# Patient Record
Sex: Male | Born: 1950 | Race: White | Hispanic: No | Smoking: Never smoker
Health system: Southern US, Community
[De-identification: ages and names within clinical notes are randomized; demographics above are authoritative.]

## PROBLEM LIST (undated history)

## (undated) DIAGNOSIS — I10 Essential (primary) hypertension: Secondary | ICD-10-CM

## (undated) DIAGNOSIS — D66 Hereditary factor VIII deficiency: Secondary | ICD-10-CM

## (undated) HISTORY — PX: APPENDECTOMY: SHX54

---

## 2015-10-04 ENCOUNTER — Emergency Department
Admission: EM | Admit: 2015-10-04 | Discharge: 2015-10-04 | Disposition: A | Discharge status: Other Acute Inpatient Hospital | Payer: Self-pay | Attending: Emergency Medicine | Admitting: Emergency Medicine

## 2015-10-04 ENCOUNTER — Encounter: Payer: Self-pay | Admitting: Emergency Medicine

## 2015-10-04 ENCOUNTER — Emergency Department: Payer: Self-pay

## 2015-10-04 DIAGNOSIS — Y939 Activity, unspecified: Secondary | ICD-10-CM | POA: Insufficient documentation

## 2015-10-04 DIAGNOSIS — S065X0A Traumatic subdural hemorrhage without loss of consciousness, initial encounter: Secondary | ICD-10-CM | POA: Insufficient documentation

## 2015-10-04 DIAGNOSIS — I10 Essential (primary) hypertension: Secondary | ICD-10-CM | POA: Insufficient documentation

## 2015-10-04 DIAGNOSIS — S065XAA Traumatic subdural hemorrhage with loss of consciousness status unknown, initial encounter: Secondary | ICD-10-CM

## 2015-10-04 DIAGNOSIS — Y999 Unspecified external cause status: Secondary | ICD-10-CM | POA: Insufficient documentation

## 2015-10-04 DIAGNOSIS — W19XXXA Unspecified fall, initial encounter: Secondary | ICD-10-CM | POA: Insufficient documentation

## 2015-10-04 DIAGNOSIS — S065X9A Traumatic subdural hemorrhage with loss of consciousness of unspecified duration, initial encounter: Secondary | ICD-10-CM

## 2015-10-04 DIAGNOSIS — Y929 Unspecified place or not applicable: Secondary | ICD-10-CM | POA: Insufficient documentation

## 2015-10-04 HISTORY — DX: Hereditary factor VIII deficiency: D66

## 2015-10-04 HISTORY — DX: Essential (primary) hypertension: I10

## 2015-10-04 LAB — CBC
HCT: 46.5 % (ref 40.0–52.0)
HEMOGLOBIN: 16 g/dL (ref 13.0–18.0)
MCH: 32.2 pg (ref 26.0–34.0)
MCHC: 34.5 g/dL (ref 32.0–36.0)
MCV: 93.3 fL (ref 80.0–100.0)
PLATELETS: 252 10*3/uL (ref 150–440)
RBC: 4.98 MIL/uL (ref 4.40–5.90)
RDW: 13.3 % (ref 11.5–14.5)
WBC: 16.8 10*3/uL — AB (ref 3.8–10.6)

## 2015-10-04 LAB — APTT: APTT: 36 s (ref 24–36)

## 2015-10-04 LAB — COMPREHENSIVE METABOLIC PANEL
ALK PHOS: 55 U/L (ref 38–126)
ALT: 29 U/L (ref 17–63)
AST: 33 U/L (ref 15–41)
Albumin: 4.5 g/dL (ref 3.5–5.0)
Anion gap: 11 (ref 5–15)
BUN: 13 mg/dL (ref 6–20)
CALCIUM: 9.6 mg/dL (ref 8.9–10.3)
CHLORIDE: 93 mmol/L — AB (ref 101–111)
CO2: 26 mmol/L (ref 22–32)
CREATININE: 1.07 mg/dL (ref 0.61–1.24)
Glucose, Bld: 230 mg/dL — ABNORMAL HIGH (ref 65–99)
Potassium: 3.1 mmol/L — ABNORMAL LOW (ref 3.5–5.1)
SODIUM: 130 mmol/L — AB (ref 135–145)
Total Bilirubin: 0.9 mg/dL (ref 0.3–1.2)
Total Protein: 8.1 g/dL (ref 6.5–8.1)

## 2015-10-04 LAB — PROTIME-INR
INR: 0.88
Prothrombin Time: 12.2 seconds (ref 11.4–15.0)

## 2015-10-04 LAB — ETHANOL

## 2015-10-04 LAB — TROPONIN I: Troponin I: <0.03 ng/mL (ref <0.031)

## 2015-10-04 NOTE — ED Provider Notes (Signed)
[facility] Emergency Department Provider Note  ____________________________________________    I have reviewed the triage vital signs and the nursing notes.   HISTORY  Chief Complaint Fall    HPI Philip Huff is a 65 y.o. male who presents via EMS because of a fall last night. Patient does not remember when he fell. He does admit to drinking beers last night. Apparently he was found by family today, his father lives next to him. Patient complains of headache and "not feeling good". He denies neuro deficits. Denies blurry vision.He does admit to daily alcohol use. He denies extremity injury, no chest pain or shortness of breath no abdominal pain.     Past Medical History  Diagnosis Date  . Hypertension     There are no active problems to display for this patient.   History reviewed. No pertinent past surgical history.  No current outpatient prescriptions on file.  Allergies Review of patient's allergies indicates no known allergies.  History reviewed. No pertinent family history.  Social History Social History  Substance Use Topics  . Smoking status: Never Smoker   . Smokeless tobacco: None  . Alcohol Use: 33.6 oz/week    56 Cans of beer per week    Review of Systems  Constitutional: Positive for headache Eyes: Negative for change in vision ENT: Positive for neck pain Cardiovascular: Negative for chest pain Respiratory: Negative for shortness of breath. Gastrointestinal: Negative for abdominal pain  Musculoskeletal: Positive for neck pain Skin: "Goose egg" Neurological: Also for headache Psychiatric: no anxiety    ____________________________________________   PHYSICAL EXAM:  VITAL SIGNS: ED Triage Vitals  Enc Vitals Group     BP 10/04/15 1525 153/99 mmHg     Pulse Rate 10/04/15 1525 82     Resp 10/04/15 1525 18     Temp 10/04/15 1525 98.1 F (36.7 C)     Temp src --      SpO2 10/04/15 1520 95 %     Weight  10/04/15 1525 160 lb (72.576 kg)     Height 10/04/15 1525  (1.676 m)     Head Cir --      Peak Flow --      Pain Score 10/04/15 1528 7     Pain Loc --      Pain Edu? --      Excl. in GC? --     Constitutional: Large hematoma to the right forehead, alert and oriented Eyes: Conjunctivae are normal. No erythema or injection ENT   Head: Normocephalic. Large hematoma to the right forehead.   Mouth/Throat: Mucous membranes are moist. Cardiovascular: Normal rate, regular rhythm. Normal and symmetric distal pulses are present in the upper extremities.  Respiratory: Normal respiratory effort without tachypnea nor retractions. Breath sounds are clear and equal bilaterally.  Gastrointestinal: Soft and non-tender in all quadrants. No distention. There is no CVA tenderness. Genitourinary: deferred Musculoskeletal: Nontender with normal range of motion in all extremities. No lower extremity tenderness nor edema. Neurologic:  Normal speech and language. No gross focal neurologic deficits are appreciated. PERRLA, EOMI Skin:  Skin is warm, dry. Psychiatric: Mood and affect are normal. Patient exhibits appropriate insight and judgment.  ____________________________________________    LABS (pertinent positives/negatives)  Labs Reviewed  CBC - Abnormal; Notable for the following:    WBC 16.8 (*)    All other components within normal limits  COMPREHENSIVE METABOLIC PANEL - Abnormal; Notable for the following:    Sodium 130 (*)  Potassium 3.1 (*)    Chloride 93 (*)    Glucose, Bld 230 (*)    All other components within normal limits  TROPONIN I  ETHANOL  URINE DRUG SCREEN, QUALITATIVE ([facility] ONLY)    ____________________________________________   EKG  ED ECG REPORT I, Jene EveryKINNER, Elmer Boutelle, the attending physician, personally viewed and interpreted this ECG.  Date: 10/04/2015 EKG Time: 3:38 PM Rate: 76 Rhythm: normal sinus rhythm QRS Axis: normal Intervals: normal ST/T Wave  abnormalities: normal Conduction Disturbances: none Narrative Interpretation: unremarkable   ____________________________________________    RADIOLOGY  CT head shows large subdural hematoma  and cervical spine   ____________________________________________   PROCEDURES  Procedure(s) performed: none  Critical Care performed: yes  CRITICAL CARE Performed by: Jene EveryKINNER, Asta Corbridge   Total critical care time: 35 minutes  Critical care time was exclusive of separately billable procedures and treating other patients.  Critical care was necessary to treat or prevent imminent or life-threatening deterioration.  Critical care was time spent personally by me on the following activities: development of treatment plan with patient and/or surrogate as well as nursing, discussions with consultants, evaluation of patient's response to treatment, examination of patient, obtaining history from patient or surrogate, ordering and performing treatments and interventions, ordering and review of laboratory studies, ordering and review of radiographic studies, pulse oximetry and re-evaluation of patient's condition.   ____________________________________________   INITIAL IMPRESSION / ASSESSMENT AND PLAN / ED COURSE  Pertinent labs & imaging results that were available during my care of the patient were reviewed by me and considered in my medical decision making (see chart for details).  Patient presents after a fall with unknown down time. He has hematoma to his right forehead. The details are scant.  ----------------------------------------- 4:11 PM on 10/04/2015 -----------------------------------------  CT head shows subdural hematoma, acute. Discussed with the patient and his father, we will contact [facility] for transfer.  ----------------------------------------- 5:01 PM on 10/04/2015 -----------------------------------------  Called by Dr. Chestine Sporelark, confirming subdural. C-spine negative.  [facility] Family made aware  ____________________________________________   FINAL CLINICAL IMPRESSION(S) / ED DIAGNOSES  Final diagnoses:  Subdural hematoma (HCC)        Jene Everyobert Tanazia Achee, MD 10/04/15 1702

## 2015-10-04 NOTE — ED Notes (Signed)
Pt. Here via EMS for fall last night.  Pt. Does not remember when he fell.  Pt. Found by family.  Pt. States  Drinking alcohol last night.  Pt. States HA and back pain.

## 2015-10-04 NOTE — ED Notes (Signed)
Pt. Family states recent hx of falls.  Pt. Is alert and oriented at this time.  Pt. States having a couple of beers last night.  Pt. Does not remember today's fall.  Pt. Has bruising to frontal head.  Pt. Also states some back discomfort.  Family at bedside.

## 2015-10-04 NOTE — ED Notes (Signed)
Patient transported to CT 

## 2020-03-26 ENCOUNTER — Emergency Department: Payer: Medicare HMO

## 2020-03-26 ENCOUNTER — Inpatient Hospital Stay
Admission: EM | Admit: 2020-03-26 | Discharge: 2020-03-30 | DRG: 391 | Disposition: A | Discharge status: Home / Self Care | Payer: Medicare HMO | Attending: Internal Medicine | Admitting: Internal Medicine

## 2020-03-26 ENCOUNTER — Other Ambulatory Visit: Payer: Self-pay

## 2020-03-26 DIAGNOSIS — D67 Hereditary factor IX deficiency: Secondary | ICD-10-CM | POA: Diagnosis present

## 2020-03-26 DIAGNOSIS — Z87828 Personal history of other (healed) physical injury and trauma: Secondary | ICD-10-CM | POA: Diagnosis not present

## 2020-03-26 DIAGNOSIS — E876 Hypokalemia: Secondary | ICD-10-CM | POA: Diagnosis not present

## 2020-03-26 DIAGNOSIS — Z79899 Other long term (current) drug therapy: Secondary | ICD-10-CM

## 2020-03-26 DIAGNOSIS — K5720 Diverticulitis of large intestine with perforation and abscess without bleeding: Principal | ICD-10-CM | POA: Diagnosis present

## 2020-03-26 DIAGNOSIS — D72829 Elevated white blood cell count, unspecified: Secondary | ICD-10-CM | POA: Diagnosis present

## 2020-03-26 DIAGNOSIS — K5721 Diverticulitis of large intestine with perforation and abscess with bleeding: Secondary | ICD-10-CM | POA: Diagnosis not present

## 2020-03-26 DIAGNOSIS — Z9181 History of falling: Secondary | ICD-10-CM

## 2020-03-26 DIAGNOSIS — K611 Rectal abscess: Secondary | ICD-10-CM | POA: Diagnosis present

## 2020-03-26 DIAGNOSIS — Z20822 Contact with and (suspected) exposure to covid-19: Secondary | ICD-10-CM | POA: Diagnosis present

## 2020-03-26 DIAGNOSIS — K5780 Diverticulitis of intestine, part unspecified, with perforation and abscess without bleeding: Secondary | ICD-10-CM | POA: Diagnosis present

## 2020-03-26 DIAGNOSIS — K5781 Diverticulitis of intestine, part unspecified, with perforation and abscess with bleeding: Secondary | ICD-10-CM | POA: Diagnosis present

## 2020-03-26 DIAGNOSIS — I10 Essential (primary) hypertension: Secondary | ICD-10-CM | POA: Diagnosis present

## 2020-03-26 DIAGNOSIS — L0291 Cutaneous abscess, unspecified: Secondary | ICD-10-CM

## 2020-03-26 DIAGNOSIS — E875 Hyperkalemia: Secondary | ICD-10-CM | POA: Diagnosis present

## 2020-03-26 LAB — LACTIC ACID, PLASMA
Lactic Acid, Venous: 1.5 mmol/L (ref 0.5–1.9)
Lactic Acid, Venous: 1.4 mmol/L (ref 0.5–1.9)

## 2020-03-26 LAB — COMPREHENSIVE METABOLIC PANEL
ALT: 21 U/L (ref 0–44)
AST: 23 U/L (ref 15–41)
Albumin: 4.1 g/dL (ref 3.5–5.0)
Alkaline Phosphatase: 68 U/L (ref 38–126)
Anion gap: 14 (ref 5–15)
BUN: 19 mg/dL (ref 8–23)
CO2: 26 mmol/L (ref 22–32)
Calcium: 9.5 mg/dL (ref 8.9–10.3)
Chloride: 94 mmol/L — ABNORMAL LOW (ref 98–111)
Creatinine, Ser: 1.07 mg/dL (ref 0.61–1.24)
GFR, Estimated: >60 mL/min (ref >60)
Glucose, Bld: 143 mg/dL — ABNORMAL HIGH (ref 70–99)
Potassium: 3.4 mmol/L — ABNORMAL LOW (ref 3.5–5.1)
Sodium: 134 mmol/L — ABNORMAL LOW (ref 135–145)
Total Bilirubin: 1.1 mg/dL (ref 0.3–1.2)
Total Protein: 8.5 g/dL — ABNORMAL HIGH (ref 6.5–8.1)

## 2020-03-26 LAB — CBC
HCT: 45.9 % (ref 39.0–52.0)
Hemoglobin: 16.4 g/dL (ref 13.0–17.0)
MCH: 33 pg (ref 26.0–34.0)
MCHC: 35.7 g/dL (ref 30.0–36.0)
MCV: 92.4 fL (ref 80.0–100.0)
Platelets: 345 10*3/uL (ref 150–400)
RBC: 4.97 MIL/uL (ref 4.22–5.81)
RDW: 11.9 % (ref 11.5–15.5)
WBC: 15.4 10*3/uL — ABNORMAL HIGH (ref 4.0–10.5)
nRBC: 0 % (ref 0.0–0.2)

## 2020-03-26 LAB — RESPIRATORY PANEL BY RT PCR (FLU A&B, COVID)
Influenza A by PCR: NEGATIVE
Influenza B by PCR: NEGATIVE
SARS Coronavirus 2 by RT PCR: NEGATIVE

## 2020-03-26 LAB — URINALYSIS, COMPLETE (UACMP) WITH MICROSCOPIC
Bacteria, UA: NONE SEEN
Bilirubin Urine: NEGATIVE
Glucose, UA: NEGATIVE mg/dL
Hgb urine dipstick: NEGATIVE
Ketones, ur: NEGATIVE mg/dL
Leukocytes,Ua: NEGATIVE
Nitrite: NEGATIVE
Protein, ur: 30 mg/dL — AB
Specific Gravity, Urine: 1.018 (ref 1.005–1.030)
pH: 6 (ref 5.0–8.0)

## 2020-03-26 LAB — APTT: aPTT: 42 seconds — ABNORMAL HIGH (ref 24–36)

## 2020-03-26 LAB — LIPASE, BLOOD: Lipase: 32 U/L (ref 11–51)

## 2020-03-26 MED ORDER — IOHEXOL 350 MG/ML SOLN
75.0000 mL | Freq: Once | INTRAVENOUS | Status: AC | PRN
  Administered 2020-03-26: 75 mL via INTRAVENOUS

## 2020-03-26 MED ORDER — HYDROMORPHONE HCL 1 MG/ML IJ SOLN
0.5000 mg | Freq: Once | INTRAMUSCULAR | Status: AC
  Administered 2020-03-26: 0.5 mg via INTRAVENOUS
  Filled 2020-03-26: qty 1

## 2020-03-26 MED ORDER — METRONIDAZOLE IN NACL 5-0.79 MG/ML-% IV SOLN
500.0000 mg | Freq: Once | INTRAVENOUS | Status: AC
Start: 2020-03-26 — End: 2020-03-26
  Administered 2020-03-26: 500 mg via INTRAVENOUS
  Filled 2020-03-26: qty 100

## 2020-03-26 MED ORDER — POTASSIUM CHLORIDE CRYS ER 20 MEQ PO TBCR
40.0000 meq | EXTENDED_RELEASE_TABLET | Freq: Once | ORAL | Status: AC
  Administered 2020-03-26: 40 meq via ORAL
  Filled 2020-03-26: qty 2

## 2020-03-26 MED ORDER — METRONIDAZOLE IN NACL 5-0.79 MG/ML-% IV SOLN
500.0000 mg | Freq: Three times a day (TID) | INTRAVENOUS | Status: DC
  Administered 2020-03-27 – 2020-03-30 (×10): 500 mg via INTRAVENOUS
  Filled 2020-03-26 (×12): qty 100

## 2020-03-26 MED ORDER — HYDROMORPHONE HCL 1 MG/ML IJ SOLN
0.5000 mg | INTRAMUSCULAR | Status: DC | PRN
  Administered 2020-03-26 – 2020-03-28 (×6): 0.5 mg via INTRAVENOUS
  Filled 2020-03-26 (×6): qty 0.5

## 2020-03-26 MED ORDER — ONDANSETRON HCL 4 MG/2ML IJ SOLN
4.0000 mg | Freq: Once | INTRAMUSCULAR | Status: AC
  Administered 2020-03-26: 4 mg via INTRAVENOUS
  Filled 2020-03-26: qty 2

## 2020-03-26 MED ORDER — SODIUM CHLORIDE 0.9 % IV SOLN
1.0000 g | Freq: Once | INTRAVENOUS | Status: AC
  Administered 2020-03-26: 1 g via INTRAVENOUS
  Filled 2020-03-26: qty 10

## 2020-03-26 MED ORDER — LACTATED RINGERS IV BOLUS
1000.0000 mL | Freq: Once | INTRAVENOUS | Status: AC
  Administered 2020-03-26: 1000 mL via INTRAVENOUS

## 2020-03-26 MED ORDER — LACTATED RINGERS IV SOLN: INTRAVENOUS | Status: AC

## 2020-03-26 MED ORDER — SODIUM CHLORIDE 0.9 % IV SOLN
2.0000 g | INTRAVENOUS | Status: DC
  Administered 2020-03-27 – 2020-03-29 (×3): 2 g via INTRAVENOUS
  Filled 2020-03-26: qty 20
  Filled 2020-03-26 (×3): qty 2

## 2020-03-26 MED ORDER — ONDANSETRON HCL 4 MG PO TABS: 4.0000 mg | ORAL_TABLET | Freq: Three times a day (TID) | ORAL | Status: AC | PRN

## 2020-03-26 NOTE — ED Notes (Signed)
Unable to draw labs off of IV, called lab to ask them to straight stick patient for remaining lab work.

## 2020-03-26 NOTE — ED Triage Notes (Signed)
Pt comes pov with lower pelvic/abdominal pain for about 4 days. Pain with urination and trouble having a BM.

## 2020-03-26 NOTE — Progress Notes (Signed)
Briefly, this is a 69 y/o M presenting to the ED with roughly a week of lower abdominal/suprapubic discomfort, associated dysuria. No nausea or vomiting. No fevers or chills.  He also has a history of some kind of hemophilia (no further details available) and a subdural hematoma (per the EMR).  ED evaluation revealed leukocytosis with normal lactate, mild electrolyte abnormalities, and mild elevation of the aPTT.  CT scan was consistent with diverticulitis without evidence of perforation, but with an abscess in the pre-rectal space. He is hemodynamically stable and not in need of emergency surgical intervention at this time. Recommend admission to medicine, NPO, IVF, IV antibiotics, consultation to IR for percutaneous drain placement.  May need to involve hematology due to reported history of hemophilia. Full General Surgery consult note to follow in the AM.

## 2020-03-26 NOTE — ED Provider Notes (Signed)
[facility] Emergency Department Provider Note  ____________________________________________   First MD Initiated Contact with Patient 03/26/20 1815     (approximate)  I have reviewed the triage vital signs and the nursing notes.   HISTORY  Chief Complaint Abdominal Pain   HPI Philip Huff is a 69 y.o. male with a past medical history of HTN and unspecified hemophilia who presents for assessment of between 5 and 6 days of suprapubic abdominal discomfort associated with some burning with urination and constipation.  No prior similar episodes.  No clear alleviating aggravating or precipitating events.  Patient states he has been passing gas and has only been able to feel very small hard balls.  He denies any headache, earache, sore throat, fevers, chills, chest pain, cough, shortness of breath, back pain, rash, extremity pain, penile lesions or abnormal discharge, blood in stool, blood in his urine, or acute complaints.  Denies EtOH use illicit drug use, tobacco use.         Past Medical History:  Diagnosis Date  . Hemophilia (HCC)    "semi hemophilia"  . Hypertension     There are no problems to display for this patient.   History reviewed. No pertinent surgical history.  Prior to Admission medications   Not on File    Allergies Patient has no known allergies.  History reviewed. No pertinent family history.  Social History Social History   Tobacco Use  . Smoking status: Never Smoker  Substance Use Topics  . Alcohol use: Yes    Alcohol/week: 56.0 standard drinks    Types: 56 Cans of beer per week  . Drug use: No    Review of Systems  Review of Systems  Constitutional: Negative for chills and fever.  HENT: Negative for sore throat.   Eyes: Negative for pain.  Respiratory: Negative for cough and stridor.   Cardiovascular: Negative for chest pain.  Gastrointestinal: Positive for abdominal pain, constipation and nausea. Negative  for vomiting.  Genitourinary: Positive for dysuria.  Skin: Negative for rash.  Neurological: Negative for seizures, loss of consciousness and headaches.  Psychiatric/Behavioral: Negative for suicidal ideas.  All other systems reviewed and are negative.     ____________________________________________   PHYSICAL EXAM:  VITAL SIGNS: ED Triage Vitals  Enc Vitals Group     BP 03/26/20 1556 133/85     Pulse Rate 03/26/20 1556 95     Resp 03/26/20 1556 18     Temp 03/26/20 1556 97.7 F (36.5 C)     Temp Source 03/26/20 1556 Oral     SpO2 03/26/20 1556 99 %     Weight 03/26/20 1554 160 lb (72.6 kg)     Height 03/26/20 1554 5\' 6"  (1.676 m)     Head Circumference --      Peak Flow --      Pain Score 03/26/20 1554 9     Pain Loc --      Pain Edu? --      Excl. in GC? --    Vitals:   03/26/20 1556  BP: 133/85  Pulse: 95  Resp: 18  Temp: 97.7 F (36.5 C)  SpO2: 99%   Physical Exam Vitals and nursing note reviewed.  Constitutional:      Appearance: He is well-developed.  HENT:     Head: Normocephalic and atraumatic.     Right Ear: External ear normal.     Left Ear: External ear normal.     Nose: Nose normal.  Eyes:     Conjunctiva/sclera: Conjunctivae normal.  Cardiovascular:     Rate and Rhythm: Normal rate and regular rhythm.     Heart sounds: No murmur heard.   Pulmonary:     Effort: Pulmonary effort is normal. No respiratory distress.     Breath sounds: Normal breath sounds.  Abdominal:     General: There is distension.     Palpations: Abdomen is soft.     Tenderness: There is abdominal tenderness in the suprapubic area. There is no right CVA tenderness or left CVA tenderness.  Musculoskeletal:     Cervical back: Neck supple.     Right lower leg: No edema.     Left lower leg: No edema.  Skin:    General: Skin is warm and dry.     Capillary Refill: Capillary refill takes 2 to 3 seconds.  Neurological:     Mental Status: He is alert and oriented to person,  place, and time.  Psychiatric:        Mood and Affect: Mood normal.      ____________________________________________   LABS (all labs ordered are listed, but only abnormal results are displayed)  Labs Reviewed  COMPREHENSIVE METABOLIC PANEL - Abnormal; Notable for the following components:      Result Value   Sodium 134 (*)    Potassium 3.4 (*)    Chloride 94 (*)    Glucose, Bld 143 (*)    Total Protein 8.5 (*)    All other components within normal limits  CBC - Abnormal; Notable for the following components:   WBC 15.4 (*)    All other components within normal limits  URINALYSIS, COMPLETE (UACMP) WITH MICROSCOPIC - Abnormal; Notable for the following components:   Color, Urine AMBER (*)    APPearance HAZY (*)    Protein, ur 30 (*)    All other components within normal limits  APTT - Abnormal; Notable for the following components:   aPTT 42 (*)    All other components within normal limits  CULTURE, BLOOD (ROUTINE X 2)  CULTURE, BLOOD (ROUTINE X 2)  RESPIRATORY PANEL BY RT PCR (FLU A&B, COVID)  LIPASE, BLOOD  LACTIC ACID, PLASMA  LACTIC ACID, PLASMA   ____________________________________________   ____________________________________________  RADIOLOGY   Official radiology report(s): CT ABDOMEN PELVIS W CONTRAST  Result Date: 03/26/2020 CLINICAL DATA:  Abdominal pain for several days EXAM: CT ABDOMEN AND PELVIS WITH CONTRAST TECHNIQUE: Multidetector CT imaging of the abdomen and pelvis was performed using the standard protocol following bolus administration of intravenous contrast. CONTRAST:  44mL OMNIPAQUE IOHEXOL 350 MG/ML SOLN COMPARISON:  None. FINDINGS: Lower chest: No acute abnormality. Hepatobiliary: Fatty infiltration of the liver is noted. The gallbladder is within normal limits. Pancreas: Pancreas is unremarkable. Spleen: Normal in size without focal abnormality. Adrenals/Urinary Tract: Adrenal glands are within normal limits. Kidneys demonstrate a normal  enhancement pattern bilaterally. No renal calculi or urinary tract obstructive changes are seen. Delayed images demonstrate normal excretion of contrast. No obstructive changes are seen. The bladder is partially distended. Stomach/Bowel: Diverticular change of the colon is noted with evidence of diverticulitis in the distal sigmoid colon. There is a focal air-fluid collection (4.7 x 3.5 cm) identified just anterior to the rectum best seen on image number 68 of series 2 consistent with a diverticular abscess. This likely extends inferiorly from the more superior sigmoid diverticulitis. The more proximal colon is within normal limits. The appendix is not well visualized although no inflammatory changes to  suggest appendicitis are noted. Small bowel and stomach appear within normal limits. Vascular/Lymphatic: Aortic atherosclerosis. No enlarged abdominal or pelvic lymph nodes. Reproductive: Prostate is unremarkable. Other: No abdominal wall hernia or abnormality. No abdominopelvic ascites. Musculoskeletal: No acute or significant osseous findings. IMPRESSION: Diverticulitis with evidence of a pre rectal abscess as described above measuring 4.7 cm in greatest dimension. This likely a extends inferiorly from the sigmoid diverticulitis. No definitive fistulization to the bladder is seen. Fatty liver. Electronically Signed   By: Alcide Clever M.D.   On: 03/26/2020 19:53    ____________________________________________   PROCEDURES  Procedure(s) performed (including Critical Care):  .1-3 Lead EKG Interpretation Performed by: Gilles Chiquito, MD Authorized by: Gilles Chiquito, MD     Interpretation: normal     ECG rate assessment: normal     Rhythm: sinus rhythm     Ectopy: none     Conduction: normal     ____________________________________________   INITIAL IMPRESSION / ASSESSMENT AND PLAN / ED COURSE        Patient presents with [place] to history exam for assessment of several days of some lower  abdominal pain associated with constipation and burning with urination.  Patient is afebrile hemodynamically stable on arrival.  Differential includes but is not limited to diverticulitis, SBO, cystitis, pyelonephritis, kidney stone, pancreatitis.  Lipase is 32 not consistent with pancreatitis.  CMP remarkable for mild hyperkalemia with a K of 3.4 and otherwise no significant electrolyte or metabolic derangements.  There is no evidence of cholestasis.  CBC shows an elevated white blood cell count at 15.4 with normal hemoglobin and platelets.  UA has 30 protein but otherwise no evidence of infection or blood.  CT obtained shows no evidence of SBO pyelonephritis stone pancreatitis but does show evidence of diverticulitis with a 4.7 x 3.5 cm abscess.  No evidence of perforation or other acute intra-abdominal process.  Given abscess with elevated white blood cell count I did consult general surgery service.  Patient was also given below noted analgesia and antibiotics.  I did consult with Dr. Lady Gary of general surgery who stated no emergent surgery was indicated at this time and who agreed with antibiotics and medicine admission with plan to see the patient in morning.  I will plan to admit to medicine service.  ____________________________________________   FINAL CLINICAL IMPRESSION(S) / ED DIAGNOSES  Final diagnoses:  Diverticulitis of intestine with abscess without bleeding, unspecified part of intestinal tract  Hypokalemia    Medications  lactated ringers bolus 1,000 mL (has no administration in time range)  cefTRIAXone (ROCEPHIN) 1 g in sodium chloride 0.9 % 100 mL IVPB (1 g Intravenous New Bag/Given 03/26/20 2031)  metroNIDAZOLE (FLAGYL) IVPB 500 mg (has no administration in time range)  potassium chloride SA (KLOR-CON) CR tablet 40 mEq (40 mEq Oral Given 03/26/20 1936)  iohexol (OMNIPAQUE) 350 MG/ML injection 75 mL (75 mLs Intravenous Contrast Given 03/26/20 1941)  HYDROmorphone  (DILAUDID) injection 0.5 mg (0.5 mg Intravenous Given 03/26/20 2028)  ondansetron (ZOFRAN) injection 4 mg (4 mg Intravenous Given 03/26/20 2028)     ED Discharge Orders    None       Note:  This document was prepared using Dragon voice recognition software and may include unintentional dictation errors.   Gilles Chiquito, MD 03/26/20 2043

## 2020-03-26 NOTE — H&P (Addendum)
History and Physical   KOUROSH JABLONSKY GMW:[id number] DOB: 07/14/1950 DOA: 03/26/2020  PCP: Pcp, No  Patient coming from: home  I have personally briefly reviewed patient's old medical records in [facility] EMR.  Chief Concern: abdominal pain x 7 days  HPI: Philip Huff is a 69 y.o. male with medical history significant for hemophilia, h/o subdural hematoma status post fall in 2017, presents to ED for chief concern of abdominal dull pain that started Wednesday, 03/19/20.  He reports the pain at it's peak was a 9/10 and now he can't feel the pain at all.  He denies changes to diet and medications. Prior to this, he was spending a few days playing with his grandchildren. He denies F/C/N/V/Cough/chest pain/shortness of breath/dysuria/hematuria/fall.  He endorsed lost of appetite.  He states he has never experienced pain like this before.  ED Course: Discussed with ED provider.  ED provider has called general surgery and states neurosurgery agrees with antibiotics and admission.  Nothing to do tonight and will see the patient tomorrow morning.  Admit for diverticulitis.  CT abdomen pelvis with contrast in the emergency department showed diverticulitis with evidence of perirectal abscess as described measuring 4.7 cm in greatest dimension.  This likely extends inferiorly from the sigmoid diverticulitis.  No definitive fistulization to the bladder is seen.  In the ED he received IV metronidazole and IV ceftriaxone.  Potassium chloride 40 mill equivalent p.o., lactated Ringer's bolus 1 L.  Review of Systems: As per HPI otherwise 10 point review of systems negative.  Past Medical History:  Diagnosis Date  . Hemophilia (HCC)    "semi hemophilia"  . Hypertension    History reviewed. No pertinent surgical history.  Social History:  reports that he has never smoked. He does not have any smokeless tobacco history on file. He reports current alcohol use of about 56.0 standard drinks of  alcohol per week. He reports that he does not use drugs.  No Known Allergies  History reviewed. No pertinent family history.   Family history: Family history reviewed and not pertinent  Physical Exam: Vitals:   03/26/20 1554 03/26/20 1556 03/26/20 2050  BP:  133/85 140/87  Pulse:  95 79  Resp:  18 19  Temp:  97.7 F (36.5 C) 98 F (36.7 C)  TempSrc:  Oral Oral  SpO2:  99% 99%  Weight: 72.6 kg    Height: 5\' 6"  (1.676 m)     Constitutional: NAD, calm, comfortable Eyes: PERRL, lids and conjunctivae normal ENMT: Mucous membranes are moist. Posterior pharynx clear of any exudate or lesions. Normal dentition.  Neck: normal, supple, no masses, no thyromegaly Respiratory: clear to auscultation bilaterally, no wheezing, no crackles. Normal respiratory effort. No accessory muscle use.  Cardiovascular: Regular rate and rhythm, no murmurs / rubs / gallops. No extremity edema. 2+ pedal pulses. No carotid bruits.  Abdomen: Obese abdomen, bilateral lower abdomen tenderness, no masses palpated. No hepatosplenomegaly. Bowel sounds positive.  Musculoskeletal: no clubbing / cyanosis. No joint deformity upper and lower extremities. Good ROM, no contractures. Normal muscle tone.  Skin: no rashes, lesions, ulcers. No induration Neurologic: CN 2-12 grossly intact. Sensation intact. Strength 5/5 in all 4.  Psychiatric: Normal judgment and insight. Alert and oriented x 4. Normal mood.   Labs on Admission: I have personally reviewed following labs and imaging studies  CBC: Recent Labs  Lab 03/26/20 1556  WBC 15.4*  HGB 16.4  HCT 45.9  MCV 92.4  PLT 345   Basic Metabolic  Panel: Recent Labs  Lab 03/26/20 1556  NA 134*  K 3.4*  CL 94*  CO2 26  GLUCOSE 143*  BUN 19  CREATININE 1.07  CALCIUM 9.5   GFR: Estimated Creatinine Clearance: 59.6 mL/min (by C-G formula based on SCr of 1.07 mg/dL).  Liver Function Tests: Recent Labs  Lab 03/26/20 1556  AST 23  ALT 21  ALKPHOS 68  BILITOT  1.1  PROT 8.5*  ALBUMIN 4.1   Recent Labs  Lab 03/26/20 1556  LIPASE 32   Urine analysis:    Component Value Date/Time   COLORURINE AMBER (A) 03/26/2020 1556   APPEARANCEUR HAZY (A) 03/26/2020 1556   LABSPEC 1.018 03/26/2020 1556   PHURINE 6.0 03/26/2020 1556   GLUCOSEU NEGATIVE 03/26/2020 1556   HGBUR NEGATIVE 03/26/2020 1556   BILIRUBINUR NEGATIVE 03/26/2020 1556   KETONESUR NEGATIVE 03/26/2020 1556   PROTEINUR 30 (A) 03/26/2020 1556   NITRITE NEGATIVE 03/26/2020 1556   LEUKOCYTESUR NEGATIVE 03/26/2020 1556   Radiological Exams on Admission: Personally reviewed and I agree with radiologist reading as below.  CT ABDOMEN PELVIS W CONTRAST  Result Date: 03/26/2020 CLINICAL DATA:  Abdominal pain for several days EXAM: CT ABDOMEN AND PELVIS WITH CONTRAST TECHNIQUE: Multidetector CT imaging of the abdomen and pelvis was performed using the standard protocol following bolus administration of intravenous contrast. CONTRAST:  27mL OMNIPAQUE IOHEXOL 350 MG/ML SOLN COMPARISON:  None. FINDINGS: Lower chest: No acute abnormality. Hepatobiliary: Fatty infiltration of the liver is noted. The gallbladder is within normal limits. Pancreas: Pancreas is unremarkable. Spleen: Normal in size without focal abnormality. Adrenals/Urinary Tract: Adrenal glands are within normal limits. Kidneys demonstrate a normal enhancement pattern bilaterally. No renal calculi or urinary tract obstructive changes are seen. Delayed images demonstrate normal excretion of contrast. No obstructive changes are seen. The bladder is partially distended. Stomach/Bowel: Diverticular change of the colon is noted with evidence of diverticulitis in the distal sigmoid colon. There is a focal air-fluid collection (4.7 x 3.5 cm) identified just anterior to the rectum best seen on image number 68 of series 2 consistent with a diverticular abscess. This likely extends inferiorly from the more superior sigmoid diverticulitis. The more  proximal colon is within normal limits. The appendix is not well visualized although no inflammatory changes to suggest appendicitis are noted. Small bowel and stomach appear within normal limits. Vascular/Lymphatic: Aortic atherosclerosis. No enlarged abdominal or pelvic lymph nodes. Reproductive: Prostate is unremarkable. Other: No abdominal wall hernia or abnormality. No abdominopelvic ascites. Musculoskeletal: No acute or significant osseous findings. IMPRESSION: Diverticulitis with evidence of a pre rectal abscess as described above measuring 4.7 cm in greatest dimension. This likely a extends inferiorly from the sigmoid diverticulitis. No definitive fistulization to the bladder is seen. Fatty liver. Electronically Signed   By: Alcide Clever M.D.   On: 03/26/2020 19:53   EKG: Independently reviewed, showing sinus rhythm with a rate of 80 no ST elevation  Assessment/Plan  Active Problems:   Diverticulitis of intestine with abscess and bleeding   Leukocytosis   Diverticulitis with evidence pre-rectal abscess Leukocytosis -Abscess measuring 4.7 cm in greatest dimension, likely extends inferiorly from the sigmoid diverticulitis, no definitive fistulization into the bladder seen -Consultation order for IR for percutaneous drain placement has been placed -General surgery has been consulted by ED provider -Status post metronidazole and ceftriaxone IV in the ED, we will continue these -Status post 1 L lactated Ringer's in the ED -Started lactated Ringer's at 125 cc/h IVF -N.p.o. -IV Zofran 4 mg every  8 hours as needed -CBC in the a.m.  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 team to obtain medical records if needed -No  acute changes in mentation at this time   DVT prophylaxis: Holding pending PT and INR Code Status: Full code Diet: N.p.o. Family Communication: None at this time Disposition Plan: Pending clinical course Consults called: General surgery Admission status: Observation  Val Schiavo N Honi Name D.O. [facility]  If 7AM-7PM, please contact day-coverage provider [email or web address]  03/26/2020, 10:39 PM

## 2020-03-27 ENCOUNTER — Inpatient Hospital Stay: Payer: Medicare HMO

## 2020-03-27 ENCOUNTER — Encounter: Payer: Self-pay | Admitting: Internal Medicine

## 2020-03-27 DIAGNOSIS — D67 Hereditary factor IX deficiency: Secondary | ICD-10-CM | POA: Diagnosis present

## 2020-03-27 DIAGNOSIS — I10 Essential (primary) hypertension: Secondary | ICD-10-CM | POA: Diagnosis present

## 2020-03-27 DIAGNOSIS — K5780 Diverticulitis of intestine, part unspecified, with perforation and abscess without bleeding: Secondary | ICD-10-CM | POA: Diagnosis not present

## 2020-03-27 DIAGNOSIS — E875 Hyperkalemia: Secondary | ICD-10-CM | POA: Diagnosis present

## 2020-03-27 DIAGNOSIS — Z79899 Other long term (current) drug therapy: Secondary | ICD-10-CM | POA: Diagnosis not present

## 2020-03-27 DIAGNOSIS — E876 Hypokalemia: Secondary | ICD-10-CM | POA: Diagnosis present

## 2020-03-27 DIAGNOSIS — Z20822 Contact with and (suspected) exposure to covid-19: Secondary | ICD-10-CM | POA: Diagnosis present

## 2020-03-27 DIAGNOSIS — K5720 Diverticulitis of large intestine with perforation and abscess without bleeding: Principal | ICD-10-CM

## 2020-03-27 DIAGNOSIS — Z9181 History of falling: Secondary | ICD-10-CM | POA: Diagnosis not present

## 2020-03-27 DIAGNOSIS — K611 Rectal abscess: Secondary | ICD-10-CM | POA: Diagnosis present

## 2020-03-27 LAB — CBC
HCT: 39.9 % (ref 39.0–52.0)
Hemoglobin: 14.5 g/dL (ref 13.0–17.0)
MCH: 33.6 pg (ref 26.0–34.0)
MCHC: 36.3 g/dL — ABNORMAL HIGH (ref 30.0–36.0)
MCV: 92.6 fL (ref 80.0–100.0)
Platelets: 315 10*3/uL (ref 150–400)
RBC: 4.31 MIL/uL (ref 4.22–5.81)
RDW: 11.9 % (ref 11.5–15.5)
WBC: 11.2 10*3/uL — ABNORMAL HIGH (ref 4.0–10.5)
nRBC: 0 % (ref 0.0–0.2)

## 2020-03-27 LAB — PROTIME-INR
INR: 1 (ref 0.8–1.2)
Prothrombin Time: 13.1 seconds (ref 11.4–15.2)

## 2020-03-27 LAB — APTT: aPTT: 45 seconds — ABNORMAL HIGH (ref 24–36)

## 2020-03-27 LAB — COMPREHENSIVE METABOLIC PANEL
ALT: 17 U/L (ref 0–44)
AST: 19 U/L (ref 15–41)
Albumin: 3.2 g/dL — ABNORMAL LOW (ref 3.5–5.0)
Alkaline Phosphatase: 50 U/L (ref 38–126)
Anion gap: 10 (ref 5–15)
BUN: 20 mg/dL (ref 8–23)
CO2: 26 mmol/L (ref 22–32)
Calcium: 8.7 mg/dL — ABNORMAL LOW (ref 8.9–10.3)
Chloride: 99 mmol/L (ref 98–111)
Creatinine, Ser: 0.88 mg/dL (ref 0.61–1.24)
GFR, Estimated: >60 mL/min (ref >60)
Glucose, Bld: 109 mg/dL — ABNORMAL HIGH (ref 70–99)
Potassium: 3.8 mmol/L (ref 3.5–5.1)
Sodium: 135 mmol/L (ref 135–145)
Total Bilirubin: 0.9 mg/dL (ref 0.3–1.2)
Total Protein: 6.5 g/dL (ref 6.5–8.1)

## 2020-03-27 LAB — HIV ANTIBODY (ROUTINE TESTING W REFLEX): HIV Screen 4th Generation wRfx: NONREACTIVE

## 2020-03-27 MED ORDER — ADULT MULTIVITAMIN W/MINERALS CH
1.0000 | ORAL_TABLET | Freq: Every day | ORAL | Status: DC
  Administered 2020-03-28 – 2020-03-30 (×3): 1 via ORAL
  Filled 2020-03-27 (×3): qty 1

## 2020-03-27 MED ORDER — EZETIMIBE 10 MG PO TABS
10.0000 mg | ORAL_TABLET | Freq: Every day | ORAL | Status: DC
  Administered 2020-03-27 – 2020-03-30 (×4): 10 mg via ORAL
  Filled 2020-03-27 (×4): qty 1

## 2020-03-27 MED ORDER — MIDAZOLAM HCL 2 MG/2ML IJ SOLN
INTRAMUSCULAR | Status: AC | PRN
  Administered 2020-03-27 (×2): 1 mg via INTRAVENOUS

## 2020-03-27 MED ORDER — FENTANYL CITRATE (PF) 100 MCG/2ML IJ SOLN
INTRAMUSCULAR | Status: AC
  Filled 2020-03-27: qty 2

## 2020-03-27 MED ORDER — MIDAZOLAM HCL 5 MG/5ML IJ SOLN
INTRAMUSCULAR | Status: AC
  Filled 2020-03-27: qty 5

## 2020-03-27 MED ORDER — FENTANYL CITRATE (PF) 100 MCG/2ML IJ SOLN
INTRAMUSCULAR | Status: AC | PRN
Start: 2020-03-27 — End: 2020-03-27
  Administered 2020-03-27 (×2): 50 ug via INTRAVENOUS

## 2020-03-27 MED ORDER — SODIUM CHLORIDE 0.9% FLUSH
5.0000 mL | Freq: Three times a day (TID) | INTRAVENOUS | Status: DC
  Administered 2020-03-27 – 2020-03-30 (×9): 5 mL

## 2020-03-27 MED ORDER — METOPROLOL SUCCINATE ER 50 MG PO TB24
50.0000 mg | ORAL_TABLET | Freq: Every day | ORAL | Status: DC
  Administered 2020-03-27 – 2020-03-30 (×4): 50 mg via ORAL
  Filled 2020-03-27 (×4): qty 1

## 2020-03-27 MED ORDER — HYDROCHLOROTHIAZIDE 25 MG PO TABS: 25.0000 mg | ORAL_TABLET | Freq: Every day | ORAL | Status: DC

## 2020-03-27 NOTE — Consult Note (Addendum)
[facility] SURGICAL CONSULTATION NOTE (initial) - cpt: 32919   HISTORY OF PRESENT ILLNESS (HPI):  69 y.o. male presented to [facility] ED yesterday for evaluation of abdominal pain. Patient reports around 6 days ago he noticed the acute onset of vague lower abdominal pain. This was a crampy and aching pain which progressively worsened and remained constant. He denied any history of similar pain in the past. No associated fever, chills, nausea, emesis, SP, SOB, urinary changes, constipation, or bloody stools. He has a history of ruptured appendicitis in the past which required open appendectomy due to bleeding complications requiring exploratory laparotomy in the same setting. He does have a history of "bleeding disorder" and hemophilia is listed in the chart but he is unable to specify. He is colonoscopy naive but states he has been doing Cologuard. Work up in the ED was concerning for leukocytosis to 15.4K (now 11.2k this morning), renal function has remained normal, mild hypokalemia on admission to 3.4 but this now resolved, normal lactic acid levels x2, and BCX without growth x2. He did have CT Abdomen/Pelvis which was concerning for sigmoid diverticulitis with fluid collection near rectum concerning for abscess. He was admitted to the medicine service with IV Abx (Rocephin + Flagyl) and IR has been consulted for consideration of percutaneous drain.   Surgery is consulted by emergency medicine physician Dr. Antoine Primas, MD in this context for evaluation and management of diverticulitis.  PAST MEDICAL HISTORY (PMH):  Past Medical History:  Diagnosis Date   Hemophilia (HCC)    "semi hemophilia"   Hypertension      PAST SURGICAL HISTORY (PSH):  History reviewed. No pertinent surgical history.   MEDICATIONS:  Prior to Admission medications   Medication Sig Start Date End Date Taking? Authorizing Provider  hydrochlorothiazide (HYDRODIURIL) 25 MG tablet Take 25 mg by mouth daily.  02/12/20  Yes [provider]  losartan (COZAAR) 50 MG tablet Take 50 mg by mouth daily. 02/12/20  Yes [provider]  metoprolol succinate (TOPROL-XL) 50 MG 24 hr tablet Take 50 mg by mouth daily. 01/16/20  Yes [provider]     ALLERGIES:  No Known Allergies   SOCIAL HISTORY:  Social History   Socioeconomic History   Marital status: Divorced    Spouse name: Not on file   Number of children: Not on file   Years of education: Not on file   Highest education level: Not on file  Occupational History   Not on file  Tobacco Use   Smoking status: Never Smoker   Smokeless tobacco: Never Used  Substance and Sexual Activity   Alcohol use: Yes    Alcohol/week: 56.0 standard drinks    Types: 56 Cans of beer per week   Drug use: No   Sexual activity: Not on file  Other Topics Concern   Not on file  Social History Narrative   Not on file   Social Determinants of Health   Financial Resource Strain:    Difficulty of Paying Living Expenses: Not on file  Food Insecurity:    Worried About Running Out of Food in the Last Year: Not on file   Ran Out of Food in the Last Year: Not on file  Transportation Needs:    Lack of Transportation (Medical): Not on file   Lack of Transportation (Non-Medical): Not on file  Physical Activity:    Days of Exercise per Week: Not on file   Minutes of Exercise per Session: Not on file  Stress:    Feeling of Stress : Not on file  Social Connections:    Frequency of Communication with Friends and Family: Not on file   Frequency of Social Gatherings with Friends and Family: Not on file   Attends Religious Services: Not on file   Active Member of Clubs or Organizations: Not on file   Attends Banker Meetings: Not on file   Marital Status: Not on file  Intimate Partner Violence:    Fear of Current or Ex-Partner: Not on file   Emotionally Abused: Not on file   Physically Abused: Not on file   Sexually Abused: Not  on file     FAMILY HISTORY:  History reviewed. No pertinent family history.    REVIEW OF SYSTEMS:  Review of Systems  Constitutional: Negative for chills and fever.  HENT: Negative for congestion and sore throat.   Respiratory: Negative for cough and shortness of breath.   Cardiovascular: Negative for chest pain and palpitations.  Gastrointestinal: Positive for abdominal pain. Negative for blood in stool, constipation, diarrhea, nausea and vomiting.  Genitourinary: Negative for dysuria and urgency.  All other systems reviewed and are negative.   VITAL SIGNS:  Temp:  [97.7 F (36.5 C)-98.5 F (36.9 C)] 97.7 F (36.5 C) (11/04 0349) Pulse Rate:  [74-95] 74 (11/04 0349) Resp:  [16-19] 16 (11/04 0349) BP: (119-140)/(85-96) 130/87 (11/04 0349) SpO2:  [97 %-99 %] 97 % (11/04 0349) Weight:  [72.6 kg] 72.6 kg (11/03 1554)     Height: 5\' 6"  (167.6 cm) Weight: 72.6 kg BMI (Calculated): 25.84   INTAKE/OUTPUT:  11/03 0701 - 11/04 0700 In: -  Out: 175 [Urine:175]  PHYSICAL EXAM:  Physical Exam Vitals and nursing note reviewed. Exam conducted with a chaperone present.  Constitutional:      General: He is not in acute distress.    Appearance: He is well-developed. He is obese. He is not ill-appearing.  HENT:     Head: Normocephalic and atraumatic.  Eyes:     General: No scleral icterus.    Extraocular Movements: Extraocular movements intact.  Cardiovascular:     Rate and Rhythm: Normal rate and regular rhythm.     Heart sounds: Normal heart sounds. No murmur heard.   Pulmonary:     Effort: Pulmonary effort is normal. No respiratory distress.     Breath sounds: Normal breath sounds.  Abdominal:     General: Abdomen is protuberant. A surgical scar is present. There is no distension.     Palpations: Abdomen is soft.     Tenderness: There is abdominal tenderness in the suprapubic area and left lower quadrant. There is no guarding or rebound.  Genitourinary:    Comments:  Deferred Skin:    General: Skin is warm and dry.     Coloration: Skin is not jaundiced or pale.  Neurological:     General: No focal deficit present.     Mental Status: He is alert and oriented to person, place, and time.  Psychiatric:        Mood and Affect: Mood normal.        Behavior: Behavior normal.      Labs:  CBC Latest Ref Rng & Units 03/27/2020 03/26/2020 10/04/2015  WBC 4.0 - 10.5 K/uL 11.2(H) 15.4(H) 16.8(H)  Hemoglobin 13.0 - 17.0 g/dL 10/06/2015 96.2 22.9  Hematocrit 39 - 52 % 39.9 45.9 46.5  Platelets 150 - 400 K/uL 315 345 252   CMP Latest Ref Rng & Units 03/27/2020  03/26/2020 10/04/2015  Glucose 70 - 99 mg/dL 761(Y) 073(X) 106(Y)  BUN 8 - 23 mg/dL 20 19 13   Creatinine 0.61 - 1.24 mg/dL 6.94 8.54  Sodium 135 - 145 mmol/L 135 134(L) 130(L)  Potassium 3.5 - 5.1 mmol/L 3.8 3.4(L) 3.1(L)  Chloride 98 - 111 mmol/L 99 94(L) 93(L)  CO2 22 - 32 mmol/L 26 26 26   Calcium 8.9 - 10.3 mg/dL 6.27) 9.5 9.6  Total Protein 6.5 - 8.1 g/dL 6.5 ) 8.1  Total Bilirubin 0.3 - 1.2 mg/dL 0.9 1.1 0.9  Alkaline Phos 38 - 126 U/L 50 68 55  AST 15 - 41 U/L 19 23 33  ALT 0 - 44 U/L 17 21 29      Imaging studies:   CT Abdomen/Pelvis (03/26/2020) personally reviewed showing inflammatory changes surrounding sigmoid colon concerning for diverticulitis with fluid collection anterior to the rectum concerning for abscess, and radiologist report reviewed:  IMPRESSION: Diverticulitis with evidence of a pre rectal abscess as described above measuring 4.7 cm in greatest dimension. This likely a extends inferiorly from the sigmoid diverticulitis. No definitive fistulization to the bladder is seen.   Assessment/Plan: (ICD-10's: K9.80) 69 y.o. male with leukocytosis and abdominal pain found to have complicated diverticulitis and abscess without pneumoperitoneum or peritonitis.   - Appreciate medicine admission  - No emergent surgical intervention; He understands that if he fails conservative  measures or clinically deteriorates, then he will require more urgent intervention and likely colostomy  - Agree with IR consultation; suspect this abscess may be in a challenging location to drain  - For now, continue NPO + IVF Resuscitation  - Continue IV Abx (Rocephin + Flagyl)   - Monitor abdominal examination  - Pain control prn; antiemetics prn  - Monitor leukocytosis; improving   - Mobilization as tolerates  - May need to ask hematology to evaluate given his unspecified "bleeding disorder," in the event that surgery becomes necessary.  - Further management per primary service; we will follow    All of the above findings and recommendations were discussed with the patient, and all of patient's questions were answered to his expressed satisfaction.  Thank you for the opportunity to participate in this patient's care.   -- 13/07/2019, PA-C [facility] 03/27/2020, 7:29 AM [phone] M-F: 7am - 4pm  I saw and evaluated the patient.  I agree with the above documentation, exam, and plan, which I have edited where appropriate. Lynden Oxford  9:37 AM

## 2020-03-27 NOTE — Plan of Care (Signed)
Nutrition Education Note  Pt with new diverticulitis diagnosis   RD provided "Nutrition and Diverticulitis" handout with supporting information. Reviewed patient's dietary recall. Provided examples of low and high fiber foods. Discouraged intake of high fiber foods, high fat foods, spicy foods, processed foods, caffeine and red meats when having a flare. Encouraged pt to cook foods until they are soft and chew foods well to help aid in digestion. Also recommend frequent small meals. Encouraged use of a multi-vitamin and protein supplements while having a flare.    RD encourage intake of high fiber foods when not having a flare.   Expect fair compliance.  Body mass index is 25.82 kg/m. Pt meets criteria for overweight based on current BMI.  No further nutrition interventions warranted at this time. RD contact information provided. If additional nutrition issues arise, please re-consult RD.  Betsey Holiday MS, RD, LDN Please refer to [facility] for RD and/or RD on-call/weekend/after hours pager

## 2020-03-27 NOTE — Procedures (Signed)
Pre procedural Dx: Pelvic abscess Post procedural Dx: Same  Technically successful CT guided placed of a 10 Fr drainage catheter placement into the pelvic abscess via left transgluteal approach yielding 30 cc of purulent, slightly bloody fluid .    A representative aspirated sample was capped and sent to the laboratory for analysis.    EBL: Trace Complications: None immediate  Katherina Right, MD Pager #: [phone]

## 2020-03-27 NOTE — Progress Notes (Signed)
Patient clinically stable post Abscess drain placement per Dr Grace Isaac, tolerated well with 10Fr drain size. Awake/alert and oriented post procedure. Vitals stable pre and post procedure.report given to TIA RN on 2C with questions answered. Received Versed 2 mg along with Fentanyl 100 mcg IV for procedure.

## 2020-03-27 NOTE — Progress Notes (Signed)
Nutrition Brief Note  Patient identified on the Malnutrition Screening Tool (MST) Report  69 year old male with history significant for hemophilia of unknown type, history of subdural hematoma status post mechanical fall in 2017 who presents with diverticulitis.   Met with pt in room today. Pt reports good appetite and oral intake at baseline but reports that he has not eaten anything for the past 4 days r/t abdominal pain. Pt NPO today for IR drain placement today. Pt reports that he is starving today and wants to eat. Pt is upset that he will have to start on a liquid diet. RD discussed with pt the importance of adequate nutrition needed to preserve lean muscle. Pt declines all nutritional interventions today. Pt reports that he is unable to drink any type of nutrition supplement as he reports they are nasty. RD recommended that pt try and choose a good protein source with all of his meals once his diet is advanced. RD will add daily MVI.   Wt Readings from Last 15 Encounters:  03/26/20 72.6 kg  10/04/15 72.6 kg    Body mass index is 25.82 kg/m. Patient meets criteria for overweight based on current BMI.   Nutrition-Focused physical exam completed. Findings are mild fat depletions in arms, mild muscle depletions in calves and patellar regions and no edema.   Pt declines all nutritional interventions at this time   Koleen Distance MS, RD, LDN Please refer to [facility] for RD and/or RD on-call/weekend/after hours pager

## 2020-03-27 NOTE — Consult Note (Signed)
Chief Complaint: Pelvic abscess   Referring Physician(s): Cannon (Surgery)  Patient Status: [facility] - In-pt  History of Present Illness: Philip Huff is a 69 y.o. male with past medical history significant for hemophilia who was found to have a diverticular abscess located within the central aspect of the pelvis.  As such, patient now presents for attempted CT-guided aspiration and drainage catheter placement.  He continues to complain of abdominal/pelvic pain.  He is otherwise without complaint.  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 No pertinent surgical history.  Allergies: Patient has no known allergies.  Medications: Prior to Admission medications   Medication Sig Start Date End Date Taking? Authorizing Provider  ezetimibe (ZETIA) 10 MG tablet Take 10 mg by mouth daily.   Yes [provider]  hydrochlorothiazide (HYDRODIURIL) 25 MG tablet Take 25 mg by mouth daily. 02/12/20  Yes [provider]  losartan (COZAAR) 50 MG tablet Take 50 mg by mouth daily. 02/12/20  Yes [provider]  metoprolol succinate (TOPROL-XL) 50 MG 24 hr tablet Take 50 mg by mouth daily. 01/16/20  Yes [provider]     History reviewed. No pertinent family history.  Social History   Socioeconomic History   Marital status: Divorced    Spouse name: Not on file   Number of children: Not on file   Years of education: Not on file   Highest education level: Not on file  Occupational History   Not on file  Tobacco Use   Smoking status: Never Smoker   Smokeless tobacco: Never Used  Substance and Sexual Activity   Alcohol use: Yes    Alcohol/week: 56.0 standard drinks    Types: 56 Cans of beer per week   Drug use: No   Sexual activity: Not on file  Other Topics Concern   Not on file  Social History Narrative   Not on file   Social Determinants of Health    Financial Resource Strain:    Difficulty of Paying Living Expenses: Not on file  Food Insecurity:    Worried About Running Out of Food in the Last Year: Not on file   Ran Out of Food in the Last Year: Not on file  Transportation Needs:    Lack of Transportation (Medical): Not on file   Lack of Transportation (Non-Medical): Not on file  Physical Activity:    Days of Exercise per Week: Not on file   Minutes of Exercise per Session: Not on file  Stress:    Feeling of Stress : Not on file  Social Connections:    Frequency of Communication with Friends and Family: Not on file   Frequency of Social Gatherings with Friends and Family: Not on file   Attends Religious Services: Not on file   Active Member of Clubs or Organizations: Not on file   Attends Banker Meetings: Not on file   Marital Status: Not on file    ECOG Status: 1 - Symptomatic but completely ambulatory  Review of Systems: A 12 point ROS discussed and pertinent positives are indicated in the HPI above.  All other systems are negative.  Review of Systems  Vital Signs: BP 124/78    Pulse 76    Temp (!) 97.5 F (36.4 C)    Resp 20    Ht 5\' 6"  (1.676 m)    Wt 72.6 kg  SpO2 100%    BMI 25.82 kg/m   Physical Exam  Imaging: CT ABDOMEN PELVIS W CONTRAST  Result Date: 03/26/2020 CLINICAL DATA:  Abdominal pain for several days EXAM: CT ABDOMEN AND PELVIS WITH CONTRAST TECHNIQUE: Multidetector CT imaging of the abdomen and pelvis was performed using the standard protocol following bolus administration of intravenous contrast. CONTRAST:  37mL OMNIPAQUE IOHEXOL 350 MG/ML SOLN COMPARISON:  None. FINDINGS: Lower chest: No acute abnormality. Hepatobiliary: Fatty infiltration of the liver is noted. The gallbladder is within normal limits. Pancreas: Pancreas is unremarkable. Spleen: Normal in size without focal abnormality. Adrenals/Urinary Tract: Adrenal glands are within normal limits. Kidneys  demonstrate a normal enhancement pattern bilaterally. No renal calculi or urinary tract obstructive changes are seen. Delayed images demonstrate normal excretion of contrast. No obstructive changes are seen. The bladder is partially distended. Stomach/Bowel: Diverticular change of the colon is noted with evidence of diverticulitis in the distal sigmoid colon. There is a focal air-fluid collection (4.7 x 3.5 cm) identified just anterior to the rectum best seen on image number 68 of series 2 consistent with a diverticular abscess. This likely extends inferiorly from the more superior sigmoid diverticulitis. The more proximal colon is within normal limits. The appendix is not well visualized although no inflammatory changes to suggest appendicitis are noted. Small bowel and stomach appear within normal limits. Vascular/Lymphatic: Aortic atherosclerosis. No enlarged abdominal or pelvic lymph nodes. Reproductive: Prostate is unremarkable. Other: No abdominal wall hernia or abnormality. No abdominopelvic ascites. Musculoskeletal: No acute or significant osseous findings. IMPRESSION: Diverticulitis with evidence of a pre rectal abscess as described above measuring 4.7 cm in greatest dimension. This likely a extends inferiorly from the sigmoid diverticulitis. No definitive fistulization to the bladder is seen. Fatty liver. Electronically Signed   By: Alcide Clever M.D.   On: 03/26/2020 19:53    Labs:  CBC: Recent Labs    03/26/20 1556 03/27/20 0524  WBC 15.4* 11.2*  HGB 16.4 14.5  HCT 45.9 39.9  PLT 345 315    COAGS: Recent Labs    03/26/20 2010 03/27/20 0524  INR  --  1.0  APTT 42* 45*    BMP: Recent Labs    03/26/20 1556 03/27/20 0524  NA 134* 135  K 3.4* 3.8  CL 94* 99  CO2 26 26  GLUCOSE 143* 109*  BUN 19 20  CALCIUM 9.5 8.7*  CREATININE 1.07 0.88  GFRNONAA >60 >60    LIVER FUNCTION TESTS: Recent Labs    03/26/20 1556 03/27/20 0524  BILITOT 1.1 0.9  AST 23 19  ALT 21 17   ALKPHOS 68 50  PROT 8.5* 6.5  ALBUMIN 4.1 3.2*    TUMOR MARKERS: No results for input(s): AFPTM, CEA, CA199, CHROMGRNA in the last 8760 hours.  Assessment and Plan:  Philip Huff is a 69 y.o. male with past medical history significant for hemophilia who was found to have a diverticular abscess located within the central aspect of the pelvis.  As such, patient now presents for attempted CT-guided aspiration and drainage catheter placement.  He continues to complain of abdominal/pelvic pain.  He is otherwise without complaint.  Risks and benefits CT-guided transgluteal aspiration intervention catheter placement was discussed with the patient including bleeding, infection, damage to adjacent structures, bowel perforation/fistula connection, and sepsis.  All of the patient's questions were answered, patient is agreeable to proceed. Consent signed and in chart.    Thank you for this interesting consult.  I greatly enjoyed meeting Philip Confer  Huff and look forward to participating in their care.  A copy of this report was sent to the requesting provider on this date.  Electronically Signed: Simonne Come, MD 03/27/2020, 3:26 PM   I spent a total of 20 Minutes in face to face in clinical consultation, greater than 50% of which was counseling/coordinating care for CT-guided percutaneous drainage catheter placement.

## 2020-03-27 NOTE — Progress Notes (Signed)
PROGRESS NOTE    Philip MemosMichael L Huff  ZOX:[id number] DOB: [birth year] DOA: 03/26/2020 PCP: Pcp, No    Brief Narrative:  69 year old male with history significant for hemophilia of unknown type, history of subdural hematoma status post mechanical fall in 2017 who presents to the ED with abdominal pain that started 6 days prior to presentation.  Found on imaging in the emergency department to have diverticulitis with 4.7 cm prerectal abscess.  General surgery consulted from emergency department.  No surgical intervention warranted at this time.  Recommend IR consultation for image guided drainage.  Patient states the pain is improved after initiation of intravenous fluids and antibiotics.  He is scheduled for IR guided drainage of prerectal abscess today.   Assessment & Plan:   Active Problems:   Diverticulitis of intestine with abscess and bleeding   Leukocytosis   Diverticulitis of intestine with abscess  Diverticulitis with rectal abscess Abscess measuring 4.7 cm in greatest dimension IR consult for drainage placed Drainage may be difficult due to location Plan: Continue maintenance fluids N.p.o. 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 Patient was instructed to follow-up few years ago but never did Coagulation studies reassuring at this time Plan: Monitor for any bleeding Or involved hematology should surgical intervention be warranted  History of subdural hematoma In 2017, secondary to mechanical fall No acute issues     DVT prophylaxis: SCD Code Status: Full Family Communication: None today Disposition Plan: Status is: Inpatient  Remains inpatient appropriate because:Inpatient level of care appropriate due to severity of illness   Dispo: The patient is from: Home               Anticipated d/c is to: Home              Anticipated d/c date is: 2 days              Patient currently is not medically stable to d/c.   N.p.o. for IR guided drainage attempt of prerectal abscess today.  Disposition plan following procedure attempt.      Consultants:   IR  General surgery  Procedures:   Image guided abscess drainage, 03/27/2020  Antimicrobials:   Ceftriaxone  Metronidazole   Subjective: Patient seen and examined.  Reports improvement in abdominal pain.  No other complaints  Objective: Vitals:   03/27/20 0100 03/27/20 0349 03/27/20 0800 03/27/20 1100  BP: (!) 119/96 130/87 138/89 134/86  Pulse: 85 74 77 77  Resp: 18 16 18 18   Temp: 98.5 F (36.9 C) 97.7 F (36.5 C) 97.8 F (36.6 C) (!) 97.5 F (36.4 C)  TempSrc: Oral Oral    SpO2: 99% 97% 99% 100%  Weight:      Height:        Intake/Output Summary (Last 24 hours) at 03/27/2020 1227 Last data filed at 03/27/2020 1100 Gross per 24 hour  Intake --  Output 525 ml  Net -525 ml   Filed Weights   03/26/20 1554  Weight: 72.6 kg    Examination:  General exam: Appears calm and comfortable  Respiratory system: Clear to auscultation. Respiratory effort normal. Cardiovascular system: S1 & S2 heard, RRR. No JVD, murmurs, rubs, gallops or clicks. No pedal edema. Gastrointestinal system: Nondistended, diffusely tender to palpation, normoactive bowel sounds Central nervous system: Alert and oriented. No focal  neurological deficits. Extremities: Symmetric 5 x 5 power. Skin: No rashes, lesions or ulcers Psychiatry: Judgement and insight appear normal. Mood & affect appropriate.     Data Reviewed: I have personally reviewed following labs and imaging studies  CBC: Recent Labs  Lab 03/26/20 1556 03/27/20 0524  WBC 15.4* 11.2*  HGB 16.4 14.5  HCT 45.9 39.9  MCV 92.4 92.6  PLT 345 315   Basic Metabolic Panel: Recent Labs  Lab 03/26/20 1556 03/27/20 0524  NA 134* 135  K 3.4* 3.8   CL 94* 99  CO2 26 26  GLUCOSE 143* 109*  BUN 19 20  CREATININE 1.07 0.88  CALCIUM 9.5 8.7*   GFR: Estimated Creatinine Clearance: 72.5 mL/min (by C-G formula based on SCr of 0.88 mg/dL). Liver Function Tests: Recent Labs  Lab 03/26/20 1556 03/27/20 0524  AST 23 19  ALT 21 17  ALKPHOS 68 50  BILITOT 1.1 0.9  PROT 8.5* 6.5  ALBUMIN 4.1 3.2*   Recent Labs  Lab 03/26/20 1556  LIPASE 32   No results for input(s): AMMONIA in the last 168 hours. Coagulation Profile: Recent Labs  Lab 03/27/20 0524  INR 1.0   Cardiac Enzymes: No results for input(s): CKTOTAL, CKMB, CKMBINDEX, TROPONINI in the last 168 hours. BNP (last 3 results) No results for input(s): PROBNP in the last 8760 hours. HbA1C: No results for input(s): HGBA1C in the last 72 hours. CBG: No results for input(s): GLUCAP in the last 168 hours. Lipid Profile: No results for input(s): CHOL, HDL, LDLCALC, TRIG, CHOLHDL, LDLDIRECT in the last 72 hours. Thyroid Function Tests: No results for input(s): TSH, T4TOTAL, FREET4, T3FREE, THYROIDAB in the last 72 hours. Anemia Panel: No results for input(s): VITAMINB12, FOLATE, FERRITIN, TIBC, IRON, RETICCTPCT in the last 72 hours. Sepsis Labs: Recent Labs  Lab 03/26/20 1937 03/26/20 2010  LATICACIDVEN 1.5 1.4    Recent Results (from the past 240 hour(s))  Blood culture (routine x 2)     Status: None (Preliminary result)   Collection Time: 03/26/20  8:38 PM   Specimen: BLOOD  Result Value Ref Range Status   Specimen Description BLOOD LEFT ANTECUBITAL  Final   Special Requests   Final    BOTTLES DRAWN AEROBIC AND ANAEROBIC Blood Culture results may not be optimal due to an inadequate volume of blood received in culture bottles   Culture   Final    NO GROWTH < 12 HOURS Performed at [facility], [address] [place], [place] [address]    Report Status PENDING  Incomplete  Blood culture (routine x 2)     Status: None (Preliminary result)    Collection Time: 03/26/20  8:38 PM   Specimen: BLOOD  Result Value Ref Range Status   Specimen Description BLOOD BLOOD RIGHT FOREARM  Final   Special Requests   Final    BOTTLES DRAWN AEROBIC AND ANAEROBIC Blood Culture results may not be optimal due to an inadequate volume of blood received in culture bottles   Culture   Final    NO GROWTH < 12 HOURS Performed at [facility], [address], [place], [place] [address]    Report Status PENDING  Incomplete  Respiratory Panel by RT PCR (Flu A&B, Covid) - Nasopharyngeal Swab     Status: None   Collection Time: 03/26/20  8:39 PM   Specimen: Nasopharyngeal Swab  Result Value Ref Range Status   SARS Coronavirus 2 by RT PCR NEGATIVE NEGATIVE Final    Comment: (NOTE)  SARS-CoV-2 target nucleic acids are NOT DETECTED.  The SARS-CoV-2 RNA is generally detectable in upper respiratoy specimens during the acute phase of infection. The lowest concentration of SARS-CoV-2 viral copies this assay can detect is 131 copies/mL. A negative result does not preclude SARS-Cov-2 infection and should not be used as the sole basis for treatment or other patient management decisions. A negative result may occur with  improper specimen collection/handling, submission of specimen other than nasopharyngeal swab, presence of viral mutation(s) within the areas targeted by this assay, and inadequate number of viral copies (<131 copies/mL). A negative result must be combined with clinical observations, patient history, and epidemiological information. The expected result is Negative.  Fact Sheet for Patients:  https://www.moore.com/  Fact Sheet for Healthcare Providers:  https://www.young.biz/  This test is no t yet approved or cleared by the [place] FDA and  has been authorized for detection and/or diagnosis of SARS-CoV-2 by FDA under an Emergency Use Authorization (EUA). This EUA will remain  in effect  (meaning this test can be used) for the duration of the COVID-19 declaration under Section 564(b)(1) of the Act, 21 U.S.C. section 360bbb-3(b)(1), unless the authorization is terminated or revoked sooner.     Influenza A by PCR NEGATIVE NEGATIVE Final   Influenza B by PCR NEGATIVE NEGATIVE Final    Comment: (NOTE) The Xpert Xpress SARS-CoV-2/FLU/RSV assay is intended as an aid in  the diagnosis of influenza from Nasopharyngeal swab specimens and  should not be used as a sole basis for treatment. Nasal washings and  aspirates are unacceptable for Xpert Xpress SARS-CoV-2/FLU/RSV  testing.  Fact Sheet for Patients: https://www.moore.com/  Fact Sheet for Healthcare Providers: https://www.young.biz/  This test is not yet approved or cleared by the [place] FDA and  has been authorized for detection and/or diagnosis of SARS-CoV-2 by  FDA under an Emergency Use Authorization (EUA). This EUA will remain  in effect (meaning this test can be used) for the duration of the  Covid-19 declaration under Section 564(b)(1) of the Act, 21  U.S.C. section 360bbb-3(b)(1), unless the authorization is  terminated or revoked. Performed at [facility], [address] [place], [place] [address]          Radiology Studies: CT ABDOMEN PELVIS W CONTRAST  Result Date: 03/26/2020 CLINICAL DATA:  Abdominal pain for several days EXAM: CT ABDOMEN AND PELVIS WITH CONTRAST TECHNIQUE: Multidetector CT imaging of the abdomen and pelvis was performed using the standard protocol following bolus administration of intravenous contrast. CONTRAST:  61mL OMNIPAQUE IOHEXOL 350 MG/ML SOLN COMPARISON:  None. FINDINGS: Lower chest: No acute abnormality. Hepatobiliary: Fatty infiltration of the liver is noted. The gallbladder is within normal limits. Pancreas: Pancreas is unremarkable. Spleen: Normal in size without focal abnormality. Adrenals/Urinary Tract: Adrenal  glands are within normal limits. Kidneys demonstrate a normal enhancement pattern bilaterally. No renal calculi or urinary tract obstructive changes are seen. Delayed images demonstrate normal excretion of contrast. No obstructive changes are seen. The bladder is partially distended. Stomach/Bowel: Diverticular change of the colon is noted with evidence of diverticulitis in the distal sigmoid colon. There is a focal air-fluid collection (4.7 x 3.5 cm) identified just anterior to the rectum best seen on image number 68 of series 2 consistent with a diverticular abscess. This likely extends inferiorly from the more superior sigmoid diverticulitis. The more proximal colon is within normal limits. The appendix is not well visualized although no inflammatory changes to suggest appendicitis are noted. Small bowel and stomach appear  within normal limits. Vascular/Lymphatic: Aortic atherosclerosis. No enlarged abdominal or pelvic lymph nodes. Reproductive: Prostate is unremarkable. Other: No abdominal wall hernia or abnormality. No abdominopelvic ascites. Musculoskeletal: No acute or significant osseous findings. IMPRESSION: Diverticulitis with evidence of a pre rectal abscess as described above measuring 4.7 cm in greatest dimension. This likely a extends inferiorly from the sigmoid diverticulitis. No definitive fistulization to the bladder is seen. Fatty liver. Electronically Signed   By: Alcide Clever M.D.   On: 03/26/2020 19:53        Scheduled Meds: Continuous Infusions: . cefTRIAXone (ROCEPHIN)  IV    . lactated ringers 125 mL/hr at 03/27/20 0840  . metronidazole 500 mg (03/27/20 0500)     LOS: 0 days    Time spent: 25 minutes    Tresa Moore, MD [facility] Pager [phone]  If 7PM-7AM, please contact night-coverage 03/27/2020, 12:27 PM

## 2020-03-28 DIAGNOSIS — K5720 Diverticulitis of large intestine with perforation and abscess without bleeding: Secondary | ICD-10-CM | POA: Diagnosis not present

## 2020-03-28 DIAGNOSIS — K5780 Diverticulitis of intestine, part unspecified, with perforation and abscess without bleeding: Secondary | ICD-10-CM | POA: Diagnosis not present

## 2020-03-28 LAB — BASIC METABOLIC PANEL
Anion gap: 11 (ref 5–15)
BUN: 14 mg/dL (ref 8–23)
CO2: 25 mmol/L (ref 22–32)
Calcium: 8.5 mg/dL — ABNORMAL LOW (ref 8.9–10.3)
Chloride: 94 mmol/L — ABNORMAL LOW (ref 98–111)
Creatinine, Ser: 0.88 mg/dL (ref 0.61–1.24)
GFR, Estimated: >60 mL/min (ref >60)
Glucose, Bld: 102 mg/dL — ABNORMAL HIGH (ref 70–99)
Potassium: 3.1 mmol/L — ABNORMAL LOW (ref 3.5–5.1)
Sodium: 130 mmol/L — ABNORMAL LOW (ref 135–145)

## 2020-03-28 LAB — CULTURE, BLOOD (ROUTINE X 2): Culture: NO GROWTH

## 2020-03-28 LAB — CBC
HCT: 37.8 % — ABNORMAL LOW (ref 39.0–52.0)
Hemoglobin: 13.2 g/dL (ref 13.0–17.0)
MCH: 32.6 pg (ref 26.0–34.0)
MCHC: 34.9 g/dL (ref 30.0–36.0)
MCV: 93.3 fL (ref 80.0–100.0)
Platelets: 271 10*3/uL (ref 150–400)
RBC: 4.05 MIL/uL — ABNORMAL LOW (ref 4.22–5.81)
RDW: 11.9 % (ref 11.5–15.5)
WBC: 13.8 10*3/uL — ABNORMAL HIGH (ref 4.0–10.5)
nRBC: 0 % (ref 0.0–0.2)

## 2020-03-28 MED ORDER — MORPHINE SULFATE (PF) 2 MG/ML IV SOLN
2.0000 mg | INTRAVENOUS | Status: DC | PRN
  Administered 2020-03-28 – 2020-03-29 (×3): 2 mg via INTRAVENOUS
  Filled 2020-03-28 (×3): qty 1

## 2020-03-28 MED ORDER — SODIUM CHLORIDE 0.9 % IV SOLN
INTRAVENOUS | Status: DC | PRN
  Administered 2020-03-28 – 2020-03-29 (×3): 250 mL via INTRAVENOUS

## 2020-03-28 MED ORDER — ACETAMINOPHEN 325 MG PO TABS: 650.0000 mg | ORAL_TABLET | Freq: Four times a day (QID) | ORAL | Status: DC | PRN

## 2020-03-28 MED ORDER — OXYCODONE HCL 5 MG PO TABS
5.0000 mg | ORAL_TABLET | ORAL | Status: DC | PRN
  Administered 2020-03-28 – 2020-03-30 (×7): 5 mg via ORAL
  Filled 2020-03-28 (×7): qty 1

## 2020-03-28 MED ORDER — POTASSIUM CHLORIDE CRYS ER 20 MEQ PO TBCR
20.0000 meq | EXTENDED_RELEASE_TABLET | Freq: Two times a day (BID) | ORAL | Status: DC
  Administered 2020-03-28 – 2020-03-30 (×5): 20 meq via ORAL
  Filled 2020-03-28 (×5): qty 1

## 2020-03-28 NOTE — Progress Notes (Addendum)
[facility] SURGICAL PROGRESS NOTE (cpt [phone])  Hospital Day(s): 1.   Interval History: Patient seen and examined, no acute events or new complaints overnight. Patient reports he overall feels better from an abdominal standpoint but has soreness in his butt from drain site. Pain medications are helping but he is trying to limit his use of these. He denies fever, chills, nausea, emesis. Slight bump in leukocytosis this morning to 13.8K which may be related to recent drain placement. Renal function normal with sCr - 0.88. He does have mild hypokalemia to 3.1. He did undergo percutaneous drainage of prerectal abscess with IR yesterday (11/04) and CX from this are pending. He remains on Rocephin + flagyl. His diet was advanced to full liquids and is tolerating this well.   Review of Systems:  Constitutional: denies fever, chills  HEENT: denies cough or congestion  Respiratory: denies any shortness of breath  Cardiovascular: denies chest pain or palpitations  Gastrointestinal: denies abdominal pain, N/V, or diarrhea/and bowel function as per interval history Genitourinary: denies burning with urination or urinary frequency  Vital signs in last 24 hours: [min-max] current  Temp:  [97.5 F (36.4 C)-98.7 F (37.1 C)] 98.7 F (37.1 C) (11/05 0637) Pulse Rate:  [75-89] 77 (11/05 0637) Resp:  [16-20] 18 (11/05 0637) BP: (110-140)/(73-99) 126/77 (11/05 0637) SpO2:  [91 %-100 %] 97 % (11/05 0637)     Height: 5\' 6"  (167.6 cm) Weight: 72.6 kg BMI (Calculated): 25.84   Intake/Output last 2 shifts:  11/04 0701 - 11/05 0700 In: 1000 [P.O.:600; IV Piggyback:400] Out: 1000 [Urine:1000]   Physical Exam:  Constitutional: alert, cooperative and no distress  HENT: normocephalic without obvious abnormality  Eyes: PERRL, EOM's grossly intact and symmetric  Respiratory: breathing non-labored at rest  Cardiovascular: regular rate and sinus rhythm  Gastrointestinal: Soft, non-tender,  non-distended, no rebound/guarding. Drain in left gluteus, output appears serosanguinous Musculoskeletal: No edema or wounds, motor and sensation grossly intact, NT    Labs:  CBC Latest Ref Rng & Units 03/27/2020 03/26/2020 10/04/2015  WBC 4.0 - 10.5 K/uL 11.2(H) 15.4(H) 16.8(H)  Hemoglobin 13.0 - 17.0 g/dL 10/06/2015 85.2 77.8  Hematocrit 39 - 52 % 39.9 45.9 46.5  Platelets 150 - 400 K/uL 315 345 252   CMP Latest Ref Rng & Units 03/27/2020 03/26/2020 10/04/2015  Glucose 70 - 99 mg/dL 10/06/2015) 353(I) 144(R)  BUN 8 - 23 mg/dL 20 19 13   Creatinine 0.61 - 1.24 mg/dL 154(M 0.86  Sodium 135 - 145 mmol/L 135 134(L) 130(L)  Potassium 3.5 - 5.1 mmol/L 3.8 3.4(L) 3.1(L)  Chloride 98 - 111 mmol/L 99 94(L) 93(L)  CO2 22 - 32 mmol/L 26 26 26   Calcium 8.9 - 10.3 mg/dL 7.61) 9.5 9.6  Total Protein 6.5 - 8.1 g/dL 6.5 9.50) 8.1  Total Bilirubin 0.3 - 1.2 mg/dL 0.9 1.1 0.9  Alkaline Phos 38 - 126 U/L 50 68 55  AST 15 - 41 U/L 19 23 33  ALT 0 - 44 U/L 17 21 29     Imaging studies: No new pertinent imaging studies   Assessment/Plan: (ICD-10's: K24.80) 69 y.o. male with slight bump in leukocytosis but overall marked clinical improvement complicated diverticulitis and abscess without pneumoperitoneum or peritonitis s/p IR placement of percutaneous drain on 11/04   - As long as he remains pain free, I think it would be reasonable to try soft diet later today  - Replete K+; monitor   - Continue IV Abx (Rocephin + Flagyl); follow up Cx from  IR drainage               - Monitor abdominal examination  - Continue percutaneous drain; monitor and record output             - Pain control prn; antiemetics prn             - Monitor leukocytosis; slightly elevated; trend             - Mobilization as tolerates             - May need to ask hematology to evaluate given his unspecified "bleeding disorder," in the event that surgery becomes necessary.             - Further management per primary service; we will follow     All of the above findings and recommendations were discussed with the patient, and the medical team, and all of patient's questions were answered to his expressed satisfaction.  -- Lynden Oxford, PA-C [facility] 03/28/2020, 7:13 AM [phone] M-F: 7am - 4pm  I saw and evaluated the patient.  I agree with the above documentation, exam, and plan, which I have edited where appropriate. Duanne Guess  11:28 AM

## 2020-03-28 NOTE — Progress Notes (Signed)
PROGRESS NOTE    Philip Huff  PFX:[id number] DOB: [birth year] DOA: 03/26/2020 PCP: Pcp, No    Brief Narrative:  69 year old male with history significant for hemophilia of unknown type, history of subdural hematoma status post mechanical fall in 2017 who presents to the ED with abdominal pain that started 6 days prior to presentation.  Found on imaging in the emergency department to have diverticulitis with 4.7 cm prerectal abscess.  General surgery consulted from emergency department.  No surgical intervention warranted at this time.  Recommend IR consultation for image guided drainage.  Patient is status post IR guided drainage of prerectal abscess and placement of indwelling drain.  Cultures are pending.  Remains on antibiotics.  Tolerated full liquids without issue.   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 Patient was instructed to follow-up few years ago but never did Coagulation studies reassuring at this time Plan: Monitor for any bleeding We will involve hematology should any surgical intervention be needed during this admission  History of subdural hematoma In 2017, secondary to mechanical fall No acute issues     DVT prophylaxis: SCD Code Status: Full Family Communication: None today Disposition Plan: Status is: Inpatient  Remains inpatient  appropriate because:Inpatient level of care appropriate due to severity of illness   Dispo: The patient is from: Home              Anticipated d/c is to: Home              Anticipated d/c date is: 2 days              Patient currently is not medically stable to d/c.   Continue monitoring post IR drain placement for prerectal abscess.  Remains on IV antibiotics.  Slowly advancing diet.  If no surgical intervention warranted anticipate discharge readiness within 48 hours.      Consultants:   IR  General surgery  Procedures:   Image guided abscess drainage, 03/27/2020  Antimicrobials:   Ceftriaxone  Metronidazole   Subjective: Patient seen and examined.  Improvement in abdominal pain.  Some soreness in the buttock near drain insertion site.  Tolerating full liquids  Objective: Vitals:   03/27/20 1545 03/28/20 0026 03/28/20 0359 03/28/20 0637  BP: 135/79 111/82 115/81 126/77  Pulse: 79 89 87 77  Resp: 16 18 18 18   Temp:  97.9 F (36.6 C) 98.6 F (37 C) 98.7 F (37.1 C)  TempSrc:  Oral Oral Oral  SpO2: 98% 98% 91% 97%  Weight:      Height:        Intake/Output Summary (Last 24 hours) at 03/28/2020 1138 Last data filed at 03/28/2020 1052 Gross per 24 hour  Intake 1240 ml  Output 650 ml  Net 590 ml   Filed Weights   03/26/20 1554  Weight: 72.6 kg    Examination:   General exam: Appears calm and comfortable  Respiratory system: Clear to auscultation. Respiratory effort normal. Cardiovascular system: S1 & S2 heard, RRR. No JVD, murmurs, rubs, gallops or clicks. No pedal edema. Gastrointestinal system: Nondistended, mild tender to palpation, normal bowel sounds, drainage catheter in place Central nervous system: Alert and oriented. No focal neurological deficits. Extremities: Symmetric 5 x 5 power. Skin: No rashes or lesions, Psychiatry: Judgement and insight appear normal. Mood & affect appropriate.    Data Reviewed: I have personally reviewed following  labs and imaging studies  CBC: Recent Labs  Lab 03/26/20 1556 03/27/20 0524 03/28/20 0759  WBC 15.4* 11.2* 13.8*  HGB 16.4 14.5 13.2  HCT 45.9 39.9 37.8*  MCV 92.4 92.6 93.3  PLT 345 315 271   Basic Metabolic Panel: Recent Labs  Lab 03/26/20 1556 03/27/20 0524 03/28/20 0759  NA 134* 135 130*  K 3.4* 3.8 3.1*  CL 94* 99 94*  CO2 26 26 25   GLUCOSE 143* 109* 102*  BUN 19 20 14   CREATININE 1.07 0.88 0.88  CALCIUM 9.5 8.7* 8.5*   GFR: Estimated Creatinine Clearance: 72.5 mL/min (by C-G formula based on SCr of 0.88 mg/dL). Liver Function Tests: Recent Labs  Lab 03/26/20 1556 03/27/20 0524  AST 23 19  ALT 21 17  ALKPHOS 68 50  BILITOT 1.1 0.9  PROT 8.5* 6.5  ALBUMIN 4.1 3.2*   Recent Labs  Lab 03/26/20 1556  LIPASE 32   No results for input(s): AMMONIA in the last 168 hours. Coagulation Profile: Recent Labs  Lab 03/27/20 0524  INR 1.0   Cardiac Enzymes: No results for input(s): CKTOTAL, CKMB, CKMBINDEX, TROPONINI in the last 168 hours. BNP (last 3 results) No results for input(s): PROBNP in the last 8760 hours. HbA1C: No results for input(s): HGBA1C in the last 72 hours. CBG: No results for input(s): GLUCAP in the last 168 hours. Lipid Profile: No results for input(s): CHOL, HDL, LDLCALC, TRIG, CHOLHDL, LDLDIRECT in the last 72 hours. Thyroid Function Tests: No results for input(s): TSH, T4TOTAL, FREET4, T3FREE, THYROIDAB in the last 72 hours. Anemia Panel: No results for input(s): VITAMINB12, FOLATE, FERRITIN, TIBC, IRON, RETICCTPCT in the last 72 hours. Sepsis Labs: Recent Labs  Lab 03/26/20 1937 03/26/20 2010  LATICACIDVEN 1.5 1.4    Recent Results (from the past 240 hour(s))  Blood culture (routine x 2)     Status: None (Preliminary result)   Collection Time: 03/26/20  8:38 PM   Specimen: BLOOD  Result Value Ref Range Status   Specimen Description BLOOD LEFT ANTECUBITAL  Final   Special Requests   Final    BOTTLES DRAWN AEROBIC AND  ANAEROBIC Blood Culture results may not be optimal due to an inadequate volume of blood received in culture bottles   Culture   Final    NO GROWTH 2 DAYS Performed at [facility], [address] [place], [place] [address]    Report Status PENDING  Incomplete  Blood culture (routine x 2)     Status: None (Preliminary result)   Collection Time: 03/26/20  8:38 PM   Specimen: BLOOD  Result Value Ref Range Status   Specimen Description BLOOD BLOOD RIGHT FOREARM  Final   Special Requests   Final    BOTTLES DRAWN AEROBIC AND ANAEROBIC Blood Culture results may not be optimal due to an inadequate volume of blood received in culture bottles   Culture   Final    NO GROWTH 2 DAYS Performed at [facility], [address] [place], [place] [address]  Report Status PENDING  Incomplete  Respiratory Panel by RT PCR (Flu A&B, Covid) - Nasopharyngeal Swab     Status: None   Collection Time: 03/26/20  8:39 PM   Specimen: Nasopharyngeal Swab  Result Value Ref Range Status   SARS Coronavirus 2 by RT PCR NEGATIVE NEGATIVE Final    Comment: (NOTE) SARS-CoV-2 target nucleic acids are NOT DETECTED.  The SARS-CoV-2 RNA is generally detectable in upper respiratoy specimens during the acute phase of infection. The lowest concentration of SARS-CoV-2 viral copies this assay can detect is 131 copies/mL. A negative result does not preclude SARS-Cov-2 infection and should not be used as the sole basis for treatment or other patient management decisions. A negative result may occur with  improper specimen collection/handling, submission of specimen other than nasopharyngeal swab, presence of viral mutation(s) within the areas targeted by this assay, and inadequate number of viral copies (<131 copies/mL). A negative result must be combined with clinical observations, patient history, and epidemiological information. The expected result is Negative.  Fact Sheet for Patients:    https://www.moore.com/  Fact Sheet for Healthcare Providers:  https://www.young.biz/  This test is no t yet approved or cleared by the [place] FDA and  has been authorized for detection and/or diagnosis of SARS-CoV-2 by FDA under an Emergency Use Authorization (EUA). This EUA will remain  in effect (meaning this test can be used) for the duration of the COVID-19 declaration under Section 564(b)(1) of the Act, 21 U.S.C. section 360bbb-3(b)(1), unless the authorization is terminated or revoked sooner.     Influenza A by PCR NEGATIVE NEGATIVE Final   Influenza B by PCR NEGATIVE NEGATIVE Final    Comment: (NOTE) The Xpert Xpress SARS-CoV-2/FLU/RSV assay is intended as an aid in  the diagnosis of influenza from Nasopharyngeal swab specimens and  should not be used as a sole basis for treatment. Nasal washings and  aspirates are unacceptable for Xpert Xpress SARS-CoV-2/FLU/RSV  testing.  Fact Sheet for Patients: https://www.moore.com/  Fact Sheet for Healthcare Providers: https://www.young.biz/  This test is not yet approved or cleared by the [place] FDA and  has been authorized for detection and/or diagnosis of SARS-CoV-2 by  FDA under an Emergency Use Authorization (EUA). This EUA will remain  in effect (meaning this test can be used) for the duration of the  Covid-19 declaration under Section 564(b)(1) of the Act, 21  U.S.C. section 360bbb-3(b)(1), unless the authorization is  terminated or revoked. Performed at [facility], [address] [place], [place] [address]   Aerobic/Anaerobic Culture (surgical/deep wound)     Status: None (Preliminary result)   Collection Time: 03/27/20  3:33 PM   Specimen: Abscess  Result Value Ref Range Status   Specimen Description   Final    ABSCESS Performed at [facility], [address], [place], [place] [address]    Special  Requests   Final    Normal Performed at [facility], [address] [place], [place] [address]    Gram Stain   Final    ABUNDANT WBC PRESENT, PREDOMINANTLY PMN ABUNDANT GRAM POSITIVE COCCI ABUNDANT GRAM POSITIVE RODS MODERATE FUNGAL ELEMENTS SEEN RARE GRAM NEGATIVE RODS    Culture   Final    TOO YOUNG TO READ Performed at [facility], [address], [place], [place] [address]    Report Status PENDING  Incomplete         Radiology Studies: CT ABDOMEN PELVIS W CONTRAST  Result Date: 03/26/2020 CLINICAL  DATA:  Abdominal pain for several days EXAM: CT ABDOMEN AND PELVIS WITH CONTRAST TECHNIQUE: Multidetector CT imaging of the abdomen and pelvis was performed using the standard protocol following bolus administration of intravenous contrast. CONTRAST:  75mL OMNIPAQUE IOHEXOL 350 MG/ML SOLN COMPARISON:  None. FINDINGS: Lower chest: No acute abnormality. Hepatobiliary: Fatty infiltration of the liver is noted. The gallbladder is within normal limits. Pancreas: Pancreas is unremarkable. Spleen: Normal in size without focal abnormality. Adrenals/Urinary Tract: Adrenal glands are within normal limits. Kidneys demonstrate a normal enhancement pattern bilaterally. No renal calculi or urinary tract obstructive changes are seen. Delayed images demonstrate normal excretion of contrast. No obstructive changes are seen. The bladder is partially distended. Stomach/Bowel: Diverticular change of the colon is noted with evidence of diverticulitis in the distal sigmoid colon. There is a focal air-fluid collection (4.7 x 3.5 cm) identified just anterior to the rectum best seen on image number 68 of series 2 consistent with a diverticular abscess. This likely extends inferiorly from the more superior sigmoid diverticulitis. The more proximal colon is within normal limits. The appendix is not well visualized although no inflammatory changes to suggest appendicitis are noted. Small bowel and  stomach appear within normal limits. Vascular/Lymphatic: Aortic atherosclerosis. No enlarged abdominal or pelvic lymph nodes. Reproductive: Prostate is unremarkable. Other: No abdominal wall hernia or abnormality. No abdominopelvic ascites. Musculoskeletal: No acute or significant osseous findings. IMPRESSION: Diverticulitis with evidence of a pre rectal abscess as described above measuring 4.7 cm in greatest dimension. This likely a extends inferiorly from the sigmoid diverticulitis. No definitive fistulization to the bladder is seen. Fatty liver. Electronically Signed   By: Alcide Clever M.D.   On: 03/26/2020 19:53   CT IMAGE GUIDED DRAINAGE BY PERCUTANEOUS CATHETER  Result Date: 03/27/2020 INDICATION: Diverticular abscess. Please perform CT-guided percutaneous aspiration and/or drainage catheter for infection source control purposes EXAM: CT-GUIDED LEFT TRANS GLUTEAL APPROACH PERCUTANEOUS DRAINAGE CATHETER PLACEMENT COMPARISON:  CT abdomen and pelvis-03/26/2020 MEDICATIONS: The patient is currently admitted to the hospital and receiving intravenous antibiotics. The antibiotics were administered within an appropriate time frame prior to the initiation of the procedure. ANESTHESIA/SEDATION: Moderate (conscious) sedation was employed during this procedure. A total of Versed 2 mg and Fentanyl 100 mcg was administered intravenously. Moderate Sedation Time: 10 minutes. The patient's level of consciousness and vital signs were monitored continuously by radiology nursing throughout the procedure under my direct supervision. CONTRAST:  None COMPLICATIONS: None immediate. PROCEDURE: Informed written consent was obtained from the patient after a discussion of the risks, benefits and alternatives to treatment. The patient was placed prone on the CT gantry and a pre procedural CT was performed re-demonstrating the known abscess/fluid collection within the midline of the lower pelvis with dominant air in fluid component  measuring approximately 4.2 x 3.7 cm (image 29, series 2). The procedure was planned. A timeout was performed prior to the initiation of the procedure. The skin overlying the left buttocks was prepped and draped in the usual sterile fashion. The overlying soft tissues were anesthetized with 1% lidocaine with epinephrine. Appropriate trajectory was planned with the use of a 22 gauge spinal needle. An 18 gauge trocar needle was advanced into the abscess/fluid collection and a short Amplatz super stiff wire was coiled within the collection. Appropriate positioning was confirmed with a limited CT scan. The tract was serially dilated allowing placement of a 10 [place] all-purpose drainage catheter. Appropriate positioning was confirmed with a limited postprocedural CT scan. Approximately 30 ml of purulent fluid was  aspirated. The tube was connected to a drainage bag and sutured in place. A dressing was placed. The patient tolerated the procedure well without immediate post procedural complication. IMPRESSION: Successful CT guided placement of a 10 [place] all purpose drain catheter into the pelvic abscess via left trans gluteal approach with aspiration of 30 mL of purulent fluid. Samples were sent to the laboratory as requested by the ordering clinical team. Electronically Signed   By: Simonne Come M.D.   On: 03/27/2020 16:19        Scheduled Meds: . ezetimibe  10 mg Oral Daily  . metoprolol succinate  50 mg Oral Daily  . multivitamin with minerals  1 tablet Oral Daily  . potassium chloride  20 mEq Oral BID  . sodium chloride flush  5 mL Intracatheter Q8H   Continuous Infusions: . cefTRIAXone (ROCEPHIN)  IV Stopped (03/27/20 1818)  . metronidazole 500 mg (03/28/20 0629)     LOS: 1 day    Time spent: 25 minutes    Tresa Moore, MD [facility] Pager [phone]  If 7PM-7AM, please contact night-coverage 03/28/2020, 11:38 AM

## 2020-03-28 NOTE — Progress Notes (Signed)
Please note the photo entered in the media section on 11/05 was entered in error.

## 2020-03-29 DIAGNOSIS — K5720 Diverticulitis of large intestine with perforation and abscess without bleeding: Secondary | ICD-10-CM | POA: Diagnosis not present

## 2020-03-29 LAB — BASIC METABOLIC PANEL
Anion gap: 10 (ref 5–15)
BUN: 12 mg/dL (ref 8–23)
CO2: 25 mmol/L (ref 22–32)
Calcium: 8.6 mg/dL — ABNORMAL LOW (ref 8.9–10.3)
Chloride: 96 mmol/L — ABNORMAL LOW (ref 98–111)
Creatinine, Ser: 0.86 mg/dL (ref 0.61–1.24)
GFR, Estimated: >60 mL/min (ref >60)
Glucose, Bld: 98 mg/dL (ref 70–99)
Potassium: 3.7 mmol/L (ref 3.5–5.1)
Sodium: 131 mmol/L — ABNORMAL LOW (ref 135–145)

## 2020-03-29 LAB — CBC
HCT: 39.2 % (ref 39.0–52.0)
Hemoglobin: 13.3 g/dL (ref 13.0–17.0)
MCH: 31.8 pg (ref 26.0–34.0)
MCHC: 33.9 g/dL (ref 30.0–36.0)
MCV: 93.8 fL (ref 80.0–100.0)
Platelets: 293 10*3/uL (ref 150–400)
RBC: 4.18 MIL/uL — ABNORMAL LOW (ref 4.22–5.81)
RDW: 11.9 % (ref 11.5–15.5)
WBC: 12.2 10*3/uL — ABNORMAL HIGH (ref 4.0–10.5)
nRBC: 0 % (ref 0.0–0.2)

## 2020-03-29 LAB — CULTURE, BLOOD (ROUTINE X 2): Culture: NO GROWTH

## 2020-03-29 MED ORDER — HYDROMORPHONE HCL 1 MG/ML IJ SOLN
0.5000 mg | INTRAMUSCULAR | Status: DC | PRN
  Administered 2020-03-29 – 2020-03-30 (×3): 0.5 mg via INTRAVENOUS
  Filled 2020-03-29 (×3): qty 0.5

## 2020-03-29 NOTE — Progress Notes (Signed)
PROGRESS NOTE    Philip Huff  TKZ:[id number] DOB: 07/29/1950 DOA: 03/26/2020 PCP: Pcp, No    Brief Narrative:  69 year old male with history significant for hemophilia of unknown type, history of subdural hematoma status post mechanical fall in 2017 who presents to the ED with abdominal pain that started 6 days prior to presentation.  Found on imaging in the emergency department to have diverticulitis with 4.7 cm prerectal abscess.  General surgery consulted from emergency department.  No surgical intervention warranted at this time.  Recommend IR consultation for image guided drainage.  Patient is status post IR guided drainage of prerectal abscess and placement of indwelling drain.  Cultures from abscess drainage are pending.  Remains on IV antibiotics.  Tolerated soft diet without issue.    Assessment & Plan:   Active Problems:   Diverticulitis of intestine with abscess and bleeding   Leukocytosis   Diverticulitis of intestine with abscess  Diverticulitis with rectal abscess Abscess measuring 4.7 cm in greatest dimension Status post evacuation of abscess and drain placement by IR 03/27/2020 Cultures from drainage are pending Plan: Continue soft diet Continue to ceftriaxone 2 g every 24 hours Continue Flagyl IV 500 mg 3 times daily Follow cultures from abscess drainage Pain control as needed Antiemetics as needed Anticipate discharge within 24 hours if patient remains stable   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 Patient was instructed to follow-up few years ago but never did Coagulation studies reassuring at this time Plan: Monitor for any bleeding We will involve hematology should any surgical intervention be needed during this admission  History of subdural hematoma In 2017, secondary to mechanical fall No acute issues     DVT prophylaxis: SCD Code Status: Full Family Communication: None today Disposition Plan:  Status is: Inpatient  Remains inpatient appropriate because:Inpatient level of care appropriate due to severity of illness   Dispo: The patient is from: Home              Anticipated d/c is to: Home              Anticipated d/c date is: 1 day              Patient currently is not medically stable to d/c.   Continue monitoring post IR drain placement for prerectal abscess.  Remains on IV antibiotics.  Advance to soft diet.  Possible discharge within 24 hours if culture results from abscess are known   Consultants:   IR  General surgery  Procedures:   Image guided abscess drainage, 03/27/2020  Antimicrobials:   Ceftriaxone  Metronidazole   Subjective: Patient seen and examined.  Improvement in abdominal pain.  Some soreness in the buttock near drain insertion site.  Tolerating soft diet  Objective: Vitals:   03/28/20 2324 03/29/20 0336 03/29/20 0926 03/29/20 1123  BP: (!) 145/80 140/88 (!) 148/86 127/83  Pulse: 84 75 91 83  Resp: 16 16 14 16   Temp: 98.3 F (36.8 C) 98 F (36.7 C) 98.8 F (37.1 C) 98.1 F (36.7 C)  TempSrc: Oral  Oral Oral  SpO2: 96% 98% 96% 98%  Weight:      Height:        Intake/Output Summary (Last 24 hours) at 03/29/2020 1151 Last data filed at 03/29/2020 0955 Gross per 24 hour  Intake 428.04 ml  Output 975 ml  Net -546.96 ml   Filed Weights   03/26/20 1554  Weight: 72.6 kg  Examination:   General exam: Appears calm and comfortable  Respiratory system: Clear to auscultation. Respiratory effort normal. Cardiovascular system: S1 & S2 heard, RRR. No JVD, murmurs, rubs, gallops or clicks. No pedal edema. Gastrointestinal system: Nondistended, mild tender to palpation, normal bowel sounds, drainage catheter in place Central nervous system: Alert and oriented. No focal neurological deficits. Extremities: Symmetric 5 x 5 power. Skin: No rashes or lesions, Psychiatry: Judgement and insight appear normal. Mood & affect appropriate.     Data Reviewed: I have personally reviewed following labs and imaging studies  CBC: Recent Labs  Lab 03/26/20 1556 03/27/20 0524 03/28/20 0759 03/29/20 0541  WBC 15.4* 11.2* 13.8* 12.2*  HGB 16.4 14.5 13.2 13.3  HCT 45.9 39.9 37.8* 39.2  MCV 92.4 92.6 93.3 93.8  PLT 345 315 271 293   Basic Metabolic Panel: Recent Labs  Lab 03/26/20 1556 03/27/20 0524 03/28/20 0759 03/29/20 0541  NA 134* 135 130* 131*  K 3.4* 3.8 3.1* 3.7  CL 94* 99 94* 96*  CO2 26 26 25 25   GLUCOSE 143* 109* 102* 98  BUN 19 20 14 12   CREATININE 1.07 0.88 0.88 0.86  CALCIUM 9.5 8.7* 8.5* 8.6*   GFR: Estimated Creatinine Clearance: 74.2 mL/min (by C-G formula based on SCr of 0.86 mg/dL). Liver Function Tests: Recent Labs  Lab 03/26/20 1556 03/27/20 0524  AST 23 19  ALT 21 17  ALKPHOS 68 50  BILITOT 1.1 0.9  PROT 8.5* 6.5  ALBUMIN 4.1 3.2*   Recent Labs  Lab 03/26/20 1556  LIPASE 32   No results for input(s): AMMONIA in the last 168 hours. Coagulation Profile: Recent Labs  Lab 03/27/20 0524  INR 1.0   Cardiac Enzymes: No results for input(s): CKTOTAL, CKMB, CKMBINDEX, TROPONINI in the last 168 hours. BNP (last 3 results) No results for input(s): PROBNP in the last 8760 hours. HbA1C: No results for input(s): HGBA1C in the last 72 hours. CBG: No results for input(s): GLUCAP in the last 168 hours. Lipid Profile: No results for input(s): CHOL, HDL, LDLCALC, TRIG, CHOLHDL, LDLDIRECT in the last 72 hours. Thyroid Function Tests: No results for input(s): TSH, T4TOTAL, FREET4, T3FREE, THYROIDAB in the last 72 hours. Anemia Panel: No results for input(s): VITAMINB12, FOLATE, FERRITIN, TIBC, IRON, RETICCTPCT in the last 72 hours. Sepsis Labs: Recent Labs  Lab 03/26/20 1937 03/26/20 2010  LATICACIDVEN 1.5 1.4    Recent Results (from the past 240 hour(s))  Blood culture (routine x 2)     Status: None (Preliminary result)   Collection Time: 03/26/20  8:38 PM   Specimen: BLOOD   Result Value Ref Range Status   Specimen Description BLOOD LEFT ANTECUBITAL  Final   Special Requests   Final    BOTTLES DRAWN AEROBIC AND ANAEROBIC Blood Culture results may not be optimal due to an inadequate volume of blood received in culture bottles   Culture   Final    NO GROWTH 3 DAYS Performed at [facility], [address], [place], [address] [place]    Report Status PENDING  Incomplete  Blood culture (routine x 2)     Status: None (Preliminary result)   Collection Time: 03/26/20  8:38 PM   Specimen: BLOOD  Result Value Ref Range Status   Specimen Description BLOOD BLOOD RIGHT FOREARM  Final   Special Requests   Final    BOTTLES DRAWN AEROBIC AND ANAEROBIC Blood Culture results may not be optimal due to an inadequate volume of blood received in culture  bottles   Culture   Final    NO GROWTH 3 DAYS Performed at [facility], [address] [place], [place] [address]    Report Status PENDING  Incomplete  Respiratory Panel by RT PCR (Flu A&B, Covid) - Nasopharyngeal Swab     Status: None   Collection Time: 03/26/20  8:39 PM   Specimen: Nasopharyngeal Swab  Result Value Ref Range Status   SARS Coronavirus 2 by RT PCR NEGATIVE NEGATIVE Final    Comment: (NOTE) SARS-CoV-2 target nucleic acids are NOT DETECTED.  The SARS-CoV-2 RNA is generally detectable in upper respiratoy specimens during the acute phase of infection. The lowest concentration of SARS-CoV-2 viral copies this assay can detect is 131 copies/mL. A negative result does not preclude SARS-Cov-2 infection and should not be used as the sole basis for treatment or other patient management decisions. A negative result may occur with  improper specimen collection/handling, submission of specimen other than nasopharyngeal swab, presence of viral mutation(s) within the areas targeted by this assay, and inadequate number of viral copies (<131 copies/mL). A negative result must be combined with  clinical observations, patient history, and epidemiological information. The expected result is Negative.  Fact Sheet for Patients:  https://www.moore.com/  Fact Sheet for Healthcare Providers:  https://www.young.biz/  This test is no t yet approved or cleared by the [place] FDA and  has been authorized for detection and/or diagnosis of SARS-CoV-2 by FDA under an Emergency Use Authorization (EUA). This EUA will remain  in effect (meaning this test can be used) for the duration of the COVID-19 declaration under Section 564(b)(1) of the Act, 21 U.S.C. section 360bbb-3(b)(1), unless the authorization is terminated or revoked sooner.     Influenza A by PCR NEGATIVE NEGATIVE Final   Influenza B by PCR NEGATIVE NEGATIVE Final    Comment: (NOTE) The Xpert Xpress SARS-CoV-2/FLU/RSV assay is intended as an aid in  the diagnosis of influenza from Nasopharyngeal swab specimens and  should not be used as a sole basis for treatment. Nasal washings and  aspirates are unacceptable for Xpert Xpress SARS-CoV-2/FLU/RSV  testing.  Fact Sheet for Patients: https://www.moore.com/  Fact Sheet for Healthcare Providers: https://www.young.biz/  This test is not yet approved or cleared by the [place] FDA and  has been authorized for detection and/or diagnosis of SARS-CoV-2 by  FDA under an Emergency Use Authorization (EUA). This EUA will remain  in effect (meaning this test can be used) for the duration of the  Covid-19 declaration under Section 564(b)(1) of the Act, 21  U.S.C. section 360bbb-3(b)(1), unless the authorization is  terminated or revoked. Performed at [facility], [address], [place], [place] [address]   Aerobic/Anaerobic Culture (surgical/deep wound)     Status: None (Preliminary result)   Collection Time: 03/27/20  3:33 PM   Specimen: Abscess  Result Value Ref Range Status    Specimen Description   Final    ABSCESS Performed at [facility], [address] [place], [place] [address]    Special Requests   Final    Normal Performed at [facility], [address] [place], [place] [address]    Gram Stain   Final    ABUNDANT WBC PRESENT, PREDOMINANTLY PMN ABUNDANT GRAM POSITIVE COCCI ABUNDANT GRAM POSITIVE RODS MODERATE FUNGAL ELEMENTS SEEN RARE GRAM NEGATIVE RODS    Culture   Final    TOO YOUNG TO READ Performed at [facility], [address], [place], [place] [address]  Report Status PENDING  Incomplete         Radiology Studies: CT IMAGE GUIDED DRAINAGE BY PERCUTANEOUS CATHETER  Result Date: 03/27/2020 INDICATION: Diverticular abscess. Please perform CT-guided percutaneous aspiration and/or drainage catheter for infection source control purposes EXAM: CT-GUIDED LEFT TRANS GLUTEAL APPROACH PERCUTANEOUS DRAINAGE CATHETER PLACEMENT COMPARISON:  CT abdomen and pelvis-03/26/2020 MEDICATIONS: The patient is currently admitted to the hospital and receiving intravenous antibiotics. The antibiotics were administered within an appropriate time frame prior to the initiation of the procedure. ANESTHESIA/SEDATION: Moderate (conscious) sedation was employed during this procedure. A total of Versed 2 mg and Fentanyl 100 mcg was administered intravenously. Moderate Sedation Time: 10 minutes. The patient's level of consciousness and vital signs were monitored continuously by radiology nursing throughout the procedure under my direct supervision. CONTRAST:  None COMPLICATIONS: None immediate. PROCEDURE: Informed written consent was obtained from the patient after a discussion of the risks, benefits and alternatives to treatment. The patient was placed prone on the CT gantry and a pre procedural CT was performed re-demonstrating the known abscess/fluid collection within the midline of the lower pelvis with dominant air in fluid component measuring  approximately 4.2 x 3.7 cm (image 29, series 2). The procedure was planned. A timeout was performed prior to the initiation of the procedure. The skin overlying the left buttocks was prepped and draped in the usual sterile fashion. The overlying soft tissues were anesthetized with 1% lidocaine with epinephrine. Appropriate trajectory was planned with the use of a 22 gauge spinal needle. An 18 gauge trocar needle was advanced into the abscess/fluid collection and a short Amplatz super stiff wire was coiled within the collection. Appropriate positioning was confirmed with a limited CT scan. The tract was serially dilated allowing placement of a 10 [place] all-purpose drainage catheter. Appropriate positioning was confirmed with a limited postprocedural CT scan. Approximately 30 ml of purulent fluid was aspirated. The tube was connected to a drainage bag and sutured in place. A dressing was placed. The patient tolerated the procedure well without immediate post procedural complication. IMPRESSION: Successful CT guided placement of a 10 French all purpose drain catheter into the pelvic abscess via left trans gluteal approach with aspiration of 30 mL of purulent fluid. Samples were sent to the laboratory as requested by the ordering clinical team. Electronically Signed   By: Simonne ComeJohn  Watts M.D.   On: 03/27/2020 16:19        Scheduled Meds: . ezetimibe  10 mg Oral Daily  . metoprolol succinate  50 mg Oral Daily  . multivitamin with minerals  1 tablet Oral Daily  . potassium chloride  20 mEq Oral BID  . sodium chloride flush  5 mL Intracatheter Q8H   Continuous Infusions: . sodium chloride 250 mL (03/29/20 0605)  . cefTRIAXone (ROCEPHIN)  IV Stopped (03/28/20 1900)  . metronidazole 500 mg (03/29/20 0605)     LOS: 2 days    Time spent: 25 minutes    Tresa MooreSudheer B Vence Lalor, MD [facility] Pager [phone]  If 7PM-7AM, please contact night-coverage 03/29/2020, 11:51 AM

## 2020-03-29 NOTE — Progress Notes (Signed)
CC: Diverticulitis Subjective: Patient still having some gluteal pain from drain site.  He overall feels okay.  No fevers or chills.  CT scan personally reviewed showing evidence of diverticulitis with pelvic abscess. Tolerating regular diet Wbc trending down  Objective: Vital signs in last 24 hours: Temp:  [98 F (36.7 C)-98.8 F (37.1 C)] 98.1 F (36.7 C) (11/06 1123) Pulse Rate:  [71-91] 83 (11/06 1123) Resp:  [14-16] 16 (11/06 1123) BP: (127-148)/(80-88) 127/83 (11/06 1123) SpO2:  [96 %-99 %] 98 % (11/06 1123) Last BM Date: 03/19/20 (said he had a little bit the other night)  Intake/Output from previous day: 11/05 0701 - 11/06 0700 In: 668 [P.O.:240; I.V.:28; IV Piggyback:400] Out: 825 [Urine:800; Drains:25] Intake/Output this shift: Total I/O In: -  Out: 160 [Urine:150; Drains:10]  Physical exam:  NAD alert Abd: soft, nt, no peritonitis, drain purulent fluid Ext: no edema and well perfused  Lab Results: CBC  Recent Labs    03/28/20 0759 03/29/20 0541  WBC 13.8* 12.2*  HGB 13.2 13.3  HCT 37.8* 39.2  PLT 271 293   BMET Recent Labs    03/28/20 0759 03/29/20 0541  NA 130* 131*  K 3.1* 3.7  CL 94* 96*  CO2 25 25  GLUCOSE 102* 98  BUN 14 12  CREATININE 0.88 0.86  CALCIUM 8.5* 8.6*   PT/INR Recent Labs    03/27/20 0524  LABPROT 13.1  INR 1.0   ABG No results for input(s): PHART, HCO3 in the last 72 hours.  Invalid input(s): PCO2, PO2  Studies/Results: CT IMAGE GUIDED DRAINAGE BY PERCUTANEOUS CATHETER  Result Date: 03/27/2020 INDICATION: Diverticular abscess. Please perform CT-guided percutaneous aspiration and/or drainage catheter for infection source control purposes EXAM: CT-GUIDED LEFT TRANS GLUTEAL APPROACH PERCUTANEOUS DRAINAGE CATHETER PLACEMENT COMPARISON:  CT abdomen and pelvis-03/26/2020 MEDICATIONS: The patient is currently admitted to the hospital and receiving intravenous antibiotics. The antibiotics were administered within an  appropriate time frame prior to the initiation of the procedure. ANESTHESIA/SEDATION: Moderate (conscious) sedation was employed during this procedure. A total of Versed 2 mg and Fentanyl 100 mcg was administered intravenously. Moderate Sedation Time: 10 minutes. The patient's level of consciousness and vital signs were monitored continuously by radiology nursing throughout the procedure under my direct supervision. CONTRAST:  None COMPLICATIONS: None immediate. PROCEDURE: Informed written consent was obtained from the patient after a discussion of the risks, benefits and alternatives to treatment. The patient was placed prone on the CT gantry and a pre procedural CT was performed re-demonstrating the known abscess/fluid collection within the midline of the lower pelvis with dominant air in fluid component measuring approximately 4.2 x 3.7 cm (image 29, series 2). The procedure was planned. A timeout was performed prior to the initiation of the procedure. The skin overlying the left buttocks was prepped and draped in the usual sterile fashion. The overlying soft tissues were anesthetized with 1% lidocaine with epinephrine. Appropriate trajectory was planned with the use of a 22 gauge spinal needle. An 18 gauge trocar needle was advanced into the abscess/fluid collection and a short Amplatz super stiff wire was coiled within the collection. Appropriate positioning was confirmed with a limited CT scan. The tract was serially dilated allowing placement of a 10 [place] all-purpose drainage catheter. Appropriate positioning was confirmed with a limited postprocedural CT scan. Approximately 30 ml of purulent fluid was aspirated. The tube was connected to a drainage bag and sutured in place. A dressing was placed. The patient tolerated the procedure well without immediate post  procedural complication. IMPRESSION: Successful CT guided placement of a 10 French all purpose drain catheter into the pelvic abscess via left trans  gluteal approach with aspiration of 30 mL of purulent fluid. Samples were sent to the laboratory as requested by the ordering clinical team. Electronically Signed   By: Simonne Come M.D.   On: 03/27/2020 16:19    Anti-infectives: Anti-infectives (From admission, onward)   Start     Dose/Rate Route Frequency Ordered Stop   03/27/20 1800  cefTRIAXone (ROCEPHIN) 2 g in sodium chloride 0.9 % 100 mL IVPB        2 g 200 mL/hr over 30 Minutes Intravenous Every 24 hours 03/26/20 2105     03/27/20 0600  metroNIDAZOLE (FLAGYL) IVPB 500 mg        500 mg 100 mL/hr over 60 Minutes Intravenous Every 8 hours 03/26/20 2105     03/26/20 2015  cefTRIAXone (ROCEPHIN) 1 g in sodium chloride 0.9 % 100 mL IVPB        1 g 200 mL/hr over 30 Minutes Intravenous  Once 03/26/20 2009 03/26/20 2101   03/26/20 2015  metroNIDAZOLE (FLAGYL) IVPB 500 mg        500 mg 100 mL/hr over 60 Minutes Intravenous  Once 03/26/20 2009 03/26/20 2224      Assessment/Plan:  Diverticular abscess status post drain placement.  Clinically doing well.  There is no need for emergent surgical intervention.  He will benefit from outpatient sigmoid colectomy electively in a few months. From general surgery perspective he can go home with Cipro and Flagyl for 2 weeks and drain.  We will be happy to see him in the office.  We will be available Please note that I spent at least 35 minutes in this encounter with greater than 50% spent in coordination and counseling of his care  Sterling Big, MD, [facility]  03/29/2020

## 2020-03-30 ENCOUNTER — Encounter: Payer: Self-pay | Admitting: Internal Medicine

## 2020-03-30 DIAGNOSIS — K5720 Diverticulitis of large intestine with perforation and abscess without bleeding: Secondary | ICD-10-CM | POA: Diagnosis not present

## 2020-03-30 LAB — AEROBIC/ANAEROBIC CULTURE W GRAM STAIN (SURGICAL/DEEP WOUND): Special Requests: NORMAL

## 2020-03-30 MED ORDER — OXYCODONE HCL 5 MG PO TABS: 5.0000 mg | ORAL_TABLET | Freq: Four times a day (QID) | ORAL | 0 refills | Status: DC | PRN

## 2020-03-30 MED ORDER — METRONIDAZOLE 500 MG PO TABS
500.0000 mg | ORAL_TABLET | Freq: Three times a day (TID) | ORAL | Status: DC
  Administered 2020-03-30: 500 mg via ORAL
  Filled 2020-03-30 (×3): qty 1

## 2020-03-30 MED ORDER — METRONIDAZOLE 500 MG PO TABS: 500.0000 mg | ORAL_TABLET | Freq: Three times a day (TID) | ORAL | 0 refills | Status: AC

## 2020-03-30 MED ORDER — METRONIDAZOLE 500 MG PO TABS: 500.0000 mg | ORAL_TABLET | Freq: Three times a day (TID) | ORAL | 0 refills | Status: DC

## 2020-03-30 MED ORDER — CIPROFLOXACIN HCL 500 MG PO TABS
500.0000 mg | ORAL_TABLET | Freq: Two times a day (BID) | ORAL | Status: DC
  Administered 2020-03-30: 500 mg via ORAL
  Filled 2020-03-30: qty 1

## 2020-03-30 MED ORDER — CIPROFLOXACIN HCL 500 MG PO TABS: 500.0000 mg | ORAL_TABLET | Freq: Two times a day (BID) | ORAL | 0 refills | Status: AC

## 2020-03-30 MED ORDER — CIPROFLOXACIN HCL 500 MG PO TABS: 500.0000 mg | ORAL_TABLET | Freq: Two times a day (BID) | ORAL | 0 refills | Status: DC

## 2020-03-30 NOTE — Progress Notes (Signed)
Discharge instructions reviewed with the patient. I showed the patient how to do the dressing and take care of his drain. IV removed. Waiting on the ride to come

## 2020-03-30 NOTE — Discharge Summary (Signed)
Physician Discharge Summary  Philip Huff GTX:[id number] DOB: Oct 10, 1950 DOA: 03/26/2020  PCP: Pcp, No  Admit date: 03/26/2020 Discharge date: 03/30/2020  Admitted From: Home Disposition:  Home  Recommendations for Outpatient Follow-up:  1. Follow up with PCP in 1-2 weeks 2. General surgery in 10 days  Home Health: No Equipment/Devices: Yes, JP drain Discharge Condition: Stable CODE STATUS: Full Diet recommendation: Heart Healthy / Carb Modified  Brief/Interim Summary: 69 year old male with history significant for hemophilia of unknown type, history of subdural hematoma status post mechanical fall in 2017 who presents to the ED with abdominal pain that started 6 days prior to presentation.  Found on imaging in the emergency department to have diverticulitis with 4.7 cm prerectal abscess.  General surgery consulted from emergency department.  No surgical intervention warranted at this time.  Recommend IR consultation for image guided drainage.  Patient is status post IR guided drainage of prerectal abscess and placement of indwelling drain.  Cultures from abscess drainage are pending.  Remains on IV antibiotics.  Tolerated soft diet without issue.  Patient continued to clinically improve.  Pain and control also improved.  On 03/30/2020 patient medically stable for discharge.  Tolerating soft diet without issue.  Patient will follow up with general surgery in 10 days post discharge.  He will be prescribed ciprofloxacin and metronidazole to complete total 14-day antibiotic course.  Patient discharged with drain in place.  Discharge Diagnoses:  Active Problems:   Diverticulitis of intestine with abscess and bleeding   Leukocytosis   Diverticulitis of intestine with abscess  Diverticulitis with rectal abscess Abscess measuring 4.7 cm in greatest dimension Status post evacuation of abscess and drain placement by IR 03/27/2020 Followed by general surgery, no surgical intervention warranted  during this admission Rocephin and Flagyl while in-house Transition to Cipro and Flagyl on discharge Complete 14-day total antibiotic course Continue soft diet on discharge Diverticulitis information included in discharge packet Follow-up with general surgery in 2 weeks  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 Patient was instructed to follow-up few years ago but never did Coagulation studies reassuring at this time Plan: No bleeding noted during admission If surgical intervention is warranted in the future suggest referral to hematology for further evaluation of underlying hemophilia  History of subdural hematoma In 2017, secondary to mechanical fall No acute issues  Discharge Instructions  Discharge Instructions    Diet - low sodium heart healthy   Complete by: As directed    Increase activity slowly   Complete by: As directed    No wound care   Complete by: As directed      Allergies as of 03/30/2020   No Known Allergies     Medication List    TAKE these medications   ciprofloxacin 500 MG tablet Commonly known as: CIPRO Take 1 tablet (500 mg total) by mouth 2 (two) times daily for 10 days.   ezetimibe 10 MG tablet Commonly known as: ZETIA Take 10 mg by mouth daily.   hydrochlorothiazide 25 MG tablet Commonly known as: HYDRODIURIL Take 25 mg by mouth daily.   losartan 50 MG tablet Commonly known as: COZAAR Take 50 mg by mouth daily.   metoprolol succinate 50 MG 24 hr tablet Commonly known as: TOPROL-XL Take 50 mg by mouth daily.   metroNIDAZOLE 500 MG tablet Commonly known as: FLAGYL Take 1 tablet (500 mg total) by mouth every 8 (eight) hours for 10 days.   oxyCODONE 5 MG immediate release  tablet Commonly known as: Oxy IR/ROXICODONE Take 1 tablet (5 mg total) by mouth every 6 (six) hours as needed for moderate pain or severe pain.       Follow-up Information    Duanne Guess, MD Follow up in 10  day(s).   Specialty: General Surgery Contact information: [address] [place] [address] [phone]              No Known Allergies  Consultations:  General surgery  Interventional radiology   Procedures/Studies: CT ABDOMEN PELVIS W CONTRAST  Result Date: 03/26/2020 CLINICAL DATA:  Abdominal pain for several days EXAM: CT ABDOMEN AND PELVIS WITH CONTRAST TECHNIQUE: Multidetector CT imaging of the abdomen and pelvis was performed using the standard protocol following bolus administration of intravenous contrast. CONTRAST:  75mL OMNIPAQUE IOHEXOL 350 MG/ML SOLN COMPARISON:  None. FINDINGS: Lower chest: No acute abnormality. Hepatobiliary: Fatty infiltration of the liver is noted. The gallbladder is within normal limits. Pancreas: Pancreas is unremarkable. Spleen: Normal in size without focal abnormality. Adrenals/Urinary Tract: Adrenal glands are within normal limits. Kidneys demonstrate a normal enhancement pattern bilaterally. No renal calculi or urinary tract obstructive changes are seen. Delayed images demonstrate normal excretion of contrast. No obstructive changes are seen. The bladder is partially distended. Stomach/Bowel: Diverticular change of the colon is noted with evidence of diverticulitis in the distal sigmoid colon. There is a focal air-fluid collection (4.7 x 3.5 cm) identified just anterior to the rectum best seen on image number 68 of series 2 consistent with a diverticular abscess. This likely extends inferiorly from the more superior sigmoid diverticulitis. The more proximal colon is within normal limits. The appendix is not well visualized although no inflammatory changes to suggest appendicitis are noted. Small bowel and stomach appear within normal limits. Vascular/Lymphatic: Aortic atherosclerosis. No enlarged abdominal or pelvic lymph nodes. Reproductive: Prostate is unremarkable. Other: No abdominal wall hernia or abnormality. No abdominopelvic  ascites. Musculoskeletal: No acute or significant osseous findings. IMPRESSION: Diverticulitis with evidence of a pre rectal abscess as described above measuring 4.7 cm in greatest dimension. This likely a extends inferiorly from the sigmoid diverticulitis. No definitive fistulization to the bladder is seen. Fatty liver. Electronically Signed   By: Alcide Clever M.D.   On: 03/26/2020 19:53   CT IMAGE GUIDED DRAINAGE BY PERCUTANEOUS CATHETER  Result Date: 03/27/2020 INDICATION: Diverticular abscess. Please perform CT-guided percutaneous aspiration and/or drainage catheter for infection source control purposes EXAM: CT-GUIDED LEFT TRANS GLUTEAL APPROACH PERCUTANEOUS DRAINAGE CATHETER PLACEMENT COMPARISON:  CT abdomen and pelvis-03/26/2020 MEDICATIONS: The patient is currently admitted to the hospital and receiving intravenous antibiotics. The antibiotics were administered within an appropriate time frame prior to the initiation of the procedure. ANESTHESIA/SEDATION: Moderate (conscious) sedation was employed during this procedure. A total of Versed 2 mg and Fentanyl 100 mcg was administered intravenously. Moderate Sedation Time: 10 minutes. The patient's level of consciousness and vital signs were monitored continuously by radiology nursing throughout the procedure under my direct supervision. CONTRAST:  None COMPLICATIONS: None immediate. PROCEDURE: Informed written consent was obtained from the patient after a discussion of the risks, benefits and alternatives to treatment. The patient was placed prone on the CT gantry and a pre procedural CT was performed re-demonstrating the known abscess/fluid collection within the midline of the lower pelvis with dominant air in fluid component measuring approximately 4.2 x 3.7 cm (image 29, series 2). The procedure was planned. A timeout was performed prior to the initiation of the procedure. The skin overlying the  left buttocks was prepped and draped in the usual sterile  fashion. The overlying soft tissues were anesthetized with 1% lidocaine with epinephrine. Appropriate trajectory was planned with the use of a 22 gauge spinal needle. An 18 gauge trocar needle was advanced into the abscess/fluid collection and a short Amplatz super stiff wire was coiled within the collection. Appropriate positioning was confirmed with a limited CT scan. The tract was serially dilated allowing placement of a 10 [place] all-purpose drainage catheter. Appropriate positioning was confirmed with a limited postprocedural CT scan. Approximately 30 ml of purulent fluid was aspirated. The tube was connected to a drainage bag and sutured in place. A dressing was placed. The patient tolerated the procedure well without immediate post procedural complication. IMPRESSION: Successful CT guided placement of a 10 French all purpose drain catheter into the pelvic abscess via left trans gluteal approach with aspiration of 30 mL of purulent fluid. Samples were sent to the laboratory as requested by the ordering clinical team. Electronically Signed   By: Simonne ComeJohn  Watts M.D.   On: 03/27/2020 16:19    (Echo, Carotid, EGD, Colonoscopy, ERCP)    Subjective: Seen and examined the day of discharge.  Stable, no distress.  Pain control improved.  Tolerating soft diet without issue.  Seen by general surgery.  Stable for discharge at this time.  Discharge Exam: Vitals:   03/30/20 0917 03/30/20 1202  BP: 108/88 139/82  Pulse: 89 73  Resp: 18 16  Temp: 98.2 F (36.8 C) 97.7 F (36.5 C)  SpO2: 99% 97%   Vitals:   03/29/20 2328 03/30/20 0450 03/30/20 0917 03/30/20 1202  BP: 140/81 (!) 145/97 108/88 139/82  Pulse: 77 77 89 73  Resp: 18 18 18 16   Temp: 98.9 F (37.2 C) 97.8 F (36.6 C) 98.2 F (36.8 C) 97.7 F (36.5 C)  TempSrc:  Oral Oral Oral  SpO2: 98% 100% 99% 97%  Weight:      Height:        General: Pt is alert, awake, not in acute distress Cardiovascular: RRR, S1/S2 +, no rubs, no  gallops Respiratory: CTA bilaterally, no wheezing, no rhonchi Abdominal: Soft, NT, ND, bowel sounds +, gluteal drain in place Extremities: no edema, no cyanosis    The results of significant diagnostics from this hospitalization (including imaging, microbiology, ancillary and laboratory) are listed below for reference.     Microbiology: Recent Results (from the past 240 hour(s))  Blood culture (routine x 2)     Status: None (Preliminary result)   Collection Time: 03/26/20  8:38 PM   Specimen: BLOOD  Result Value Ref Range Status   Specimen Description BLOOD LEFT ANTECUBITAL  Final   Special Requests   Final    BOTTLES DRAWN AEROBIC AND ANAEROBIC Blood Culture results may not be optimal due to an inadequate volume of blood received in culture bottles   Culture   Final    NO GROWTH 3 DAYS Performed at [facility], [address] [place], [place] [address]    Report Status PENDING  Incomplete  Blood culture (routine x 2)     Status: None (Preliminary result)   Collection Time: 03/26/20  8:38 PM   Specimen: BLOOD  Result Value Ref Range Status   Specimen Description BLOOD BLOOD RIGHT FOREARM  Final   Special Requests   Final    BOTTLES DRAWN AEROBIC AND ANAEROBIC Blood Culture results may not be optimal due to an inadequate volume of blood received in culture bottles  Culture   Final    NO GROWTH 3 DAYS Performed at [facility], [address] [place], [place] [address]    Report Status PENDING  Incomplete  Respiratory Panel by RT PCR (Flu A&B, Covid) - Nasopharyngeal Swab     Status: None   Collection Time: 03/26/20  8:39 PM   Specimen: Nasopharyngeal Swab  Result Value Ref Range Status   SARS Coronavirus 2 by RT PCR NEGATIVE NEGATIVE Final    Comment: (NOTE) SARS-CoV-2 target nucleic acids are NOT DETECTED.  The SARS-CoV-2 RNA is generally detectable in upper respiratoy specimens during the acute phase of infection. The lowest concentration of  SARS-CoV-2 viral copies this assay can detect is 131 copies/mL. A negative result does not preclude SARS-Cov-2 infection and should not be used as the sole basis for treatment or other patient management decisions. A negative result may occur with  improper specimen collection/handling, submission of specimen other than nasopharyngeal swab, presence of viral mutation(s) within the areas targeted by this assay, and inadequate number of viral copies (<131 copies/mL). A negative result must be combined with clinical observations, patient history, and epidemiological information. The expected result is Negative.  Fact Sheet for Patients:  https://www.moore.com/  Fact Sheet for Healthcare Providers:  https://www.young.biz/  This test is no t yet approved or cleared by the [place] FDA and  has been authorized for detection and/or diagnosis of SARS-CoV-2 by FDA under an Emergency Use Authorization (EUA). This EUA will remain  in effect (meaning this test can be used) for the duration of the COVID-19 declaration under Section 564(b)(1) of the Act, 21 U.S.C. section 360bbb-3(b)(1), unless the authorization is terminated or revoked sooner.     Influenza A by PCR NEGATIVE NEGATIVE Final   Influenza B by PCR NEGATIVE NEGATIVE Final    Comment: (NOTE) The Xpert Xpress SARS-CoV-2/FLU/RSV assay is intended as an aid in  the diagnosis of influenza from Nasopharyngeal swab specimens and  should not be used as a sole basis for treatment. Nasal washings and  aspirates are unacceptable for Xpert Xpress SARS-CoV-2/FLU/RSV  testing.  Fact Sheet for Patients: https://www.moore.com/  Fact Sheet for Healthcare Providers: https://www.young.biz/  This test is not yet approved or cleared by the [place] FDA and  has been authorized for detection and/or diagnosis of SARS-CoV-2 by  FDA under an Emergency Use  Authorization (EUA). This EUA will remain  in effect (meaning this test can be used) for the duration of the  Covid-19 declaration under Section 564(b)(1) of the Act, 21  U.S.C. section 360bbb-3(b)(1), unless the authorization is  terminated or revoked. Performed at [facility], [address] [place], [place] [address]   Aerobic/Anaerobic Culture (surgical/deep wound)     Status: None (Preliminary result)   Collection Time: 03/27/20  3:33 PM   Specimen: Abscess  Result Value Ref Range Status   Specimen Description ABSCESS  Final   Special Requests Normal  Final   Gram Stain   Final    ABUNDANT WBC PRESENT, PREDOMINANTLY PMN ABUNDANT GRAM POSITIVE COCCI ABUNDANT GRAM POSITIVE RODS MODERATE FUNGAL ELEMENTS SEEN RARE GRAM NEGATIVE RODS    Culture   Final    MODERATE STREPTOCOCCUS GROUP C FEW ESCHERICHIA COLI HOLDING FOR POSSIBLE ANAEROBE    Report Status PENDING  Incomplete   Organism ID, Bacteria ESCHERICHIA COLI  Final      Susceptibility   Escherichia coli - MIC*    AMPICILLIN <=2 SENSITIVE Sensitive     CEFAZOLIN <=4 SENSITIVE  Sensitive     CEFEPIME <=0.12 SENSITIVE Sensitive     CEFTAZIDIME <=1 SENSITIVE Sensitive     CEFTRIAXONE <=0.25 SENSITIVE Sensitive     CIPROFLOXACIN <=0.25 SENSITIVE Sensitive     GENTAMICIN <=1 SENSITIVE Sensitive     IMIPENEM <=0.25 SENSITIVE Sensitive     TRIMETH/SULFA <=20 SENSITIVE Sensitive     AMPICILLIN/SULBACTAM <=2 SENSITIVE Sensitive     PIP/TAZO Value in next row Sensitive      <=4 SENSITIVEPerformed at [facility], [address] [place], [place] [address]    * FEW ESCHERICHIA COLI     Labs: BNP (last 3 results) No results for input(s): BNP in the last 8760 hours. Basic Metabolic Panel: Recent Labs  Lab 03/26/20 1556 03/27/20 0524 03/28/20 0759 03/29/20 0541  NA 134* 135 130* 131*  K 3.4* 3.8 3.1* 3.7  CL 94* 99 94* 96*  CO2 GLUCOSE 143* 109* 102* 98  BUN CREATININE 1.07  0.88 0.88 0.86  CALCIUM 9.5 8.7* 8.5* 8.6*   Liver Function Tests: Recent Labs  Lab 03/26/20 1556 03/27/20 0524  AST 23 19  ALT 21 17  ALKPHOS 68 50  BILITOT 1.1 0.9  PROT 8.5* 6.5  ALBUMIN 4.1 3.2*   Recent Labs  Lab 03/26/20 1556  LIPASE 32   No results for input(s): AMMONIA in the last 168 hours. CBC: Recent Labs  Lab 03/26/20 1556 03/27/20 0524 03/28/20 0759 03/29/20 0541  WBC 15.4* 11.2* 13.8* 12.2*  HGB 16.4 14.5 13.2 13.3  HCT 45.9 39.9 37.8* 39.2  MCV 92.4 92.6 93.3 93.8  PLT 345 315 271 293   Cardiac Enzymes: No results for input(s): CKTOTAL, CKMB, CKMBINDEX, TROPONINI in the last 168 hours. BNP: Invalid input(s): POCBNP CBG: No results for input(s): GLUCAP in the last 168 hours. D-Dimer No results for input(s): DDIMER in the last 72 hours. Hgb A1c No results for input(s): HGBA1C in the last 72 hours. Lipid Profile No results for input(s): CHOL, HDL, LDLCALC, TRIG, CHOLHDL, LDLDIRECT in the last 72 hours. Thyroid function studies No results for input(s): TSH, T4TOTAL, T3FREE, THYROIDAB in the last 72 hours.  Invalid input(s): FREET3 Anemia work up No results for input(s): VITAMINB12, FOLATE, FERRITIN, TIBC, IRON, RETICCTPCT in the last 72 hours. Urinalysis    Component Value Date/Time   COLORURINE AMBER (A) 03/26/2020 1556   APPEARANCEUR HAZY (A) 03/26/2020 1556   LABSPEC 1.018 03/26/2020 1556   PHURINE 6.0 03/26/2020 1556   GLUCOSEU NEGATIVE 03/26/2020 1556   HGBUR NEGATIVE 03/26/2020 1556   BILIRUBINUR NEGATIVE 03/26/2020 1556   KETONESUR NEGATIVE 03/26/2020 1556   PROTEINUR 30 (A) 03/26/2020 1556   NITRITE NEGATIVE 03/26/2020 1556   LEUKOCYTESUR NEGATIVE 03/26/2020 1556   Sepsis Labs Invalid input(s): PROCALCITONIN,  WBC,  LACTICIDVEN Microbiology Recent Results (from the past 240 hour(s))  Blood culture (routine x 2)     Status: None (Preliminary result)   Collection Time: 03/26/20  8:38 PM   Specimen: BLOOD  Result Value Ref  Range Status   Specimen Description BLOOD LEFT ANTECUBITAL  Final   Special Requests   Final    BOTTLES DRAWN AEROBIC AND ANAEROBIC Blood Culture results may not be optimal due to an inadequate volume of blood received in culture bottles   Culture   Final    NO GROWTH 3 DAYS Performed at [facility], [address], [place], [place] [address]    Report Status PENDING  Incomplete  Blood culture (routine x 2)     Status: None (Preliminary result)   Collection Time: 03/26/20  8:38 PM   Specimen: BLOOD  Result Value Ref Range Status   Specimen Description BLOOD BLOOD RIGHT FOREARM  Final   Special Requests   Final    BOTTLES DRAWN AEROBIC AND ANAEROBIC Blood Culture results may not be optimal due to an inadequate volume of blood received in culture bottles   Culture   Final    NO GROWTH 3 DAYS Performed at [facility], [address] [place], [place] [address]    Report Status PENDING  Incomplete  Respiratory Panel by RT PCR (Flu A&B, Covid) - Nasopharyngeal Swab     Status: None   Collection Time: 03/26/20  8:39 PM   Specimen: Nasopharyngeal Swab  Result Value Ref Range Status   SARS Coronavirus 2 by RT PCR NEGATIVE NEGATIVE Final    Comment: (NOTE) SARS-CoV-2 target nucleic acids are NOT DETECTED.  The SARS-CoV-2 RNA is generally detectable in upper respiratoy specimens during the acute phase of infection. The lowest concentration of SARS-CoV-2 viral copies this assay can detect is 131 copies/mL. A negative result does not preclude SARS-Cov-2 infection and should not be used as the sole basis for treatment or other patient management decisions. A negative result may occur with  improper specimen collection/handling, submission of specimen other than nasopharyngeal swab, presence of viral mutation(s) within the areas targeted by this assay, and inadequate number of viral copies (<131 copies/mL). A negative result must be combined with clinical observations,  patient history, and epidemiological information. The expected result is Negative.  Fact Sheet for Patients:  https://www.moore.com/  Fact Sheet for Healthcare Providers:  https://www.young.biz/  This test is no t yet approved or cleared by the [place] FDA and  has been authorized for detection and/or diagnosis of SARS-CoV-2 by FDA under an Emergency Use Authorization (EUA). This EUA will remain  in effect (meaning this test can be used) for the duration of the COVID-19 declaration under Section 564(b)(1) of the Act, 21 U.S.C. section 360bbb-3(b)(1), unless the authorization is terminated or revoked sooner.     Influenza A by PCR NEGATIVE NEGATIVE Final   Influenza B by PCR NEGATIVE NEGATIVE Final    Comment: (NOTE) The Xpert Xpress SARS-CoV-2/FLU/RSV assay is intended as an aid in  the diagnosis of influenza from Nasopharyngeal swab specimens and  should not be used as a sole basis for treatment. Nasal washings and  aspirates are unacceptable for Xpert Xpress SARS-CoV-2/FLU/RSV  testing.  Fact Sheet for Patients: https://www.moore.com/  Fact Sheet for Healthcare Providers: https://www.young.biz/  This test is not yet approved or cleared by the [place] FDA and  has been authorized for detection and/or diagnosis of SARS-CoV-2 by  FDA under an Emergency Use Authorization (EUA). This EUA will remain  in effect (meaning this test can be used) for the duration of the  Covid-19 declaration under Section 564(b)(1) of the Act, 21  U.S.C. section 360bbb-3(b)(1), unless the authorization is  terminated or revoked. Performed at [facility], [address], [place], [place] [address]   Aerobic/Anaerobic Culture (surgical/deep wound)     Status: None (Preliminary result)   Collection Time: 03/27/20  3:33 PM   Specimen: Abscess  Result Value Ref Range Status   Specimen Description  ABSCESS  Final   Special Requests Normal  Final   Gram Stain   Final    ABUNDANT WBC PRESENT, PREDOMINANTLY PMN ABUNDANT GRAM POSITIVE COCCI  ABUNDANT GRAM POSITIVE RODS MODERATE FUNGAL ELEMENTS SEEN RARE GRAM NEGATIVE RODS    Culture   Final    MODERATE STREPTOCOCCUS GROUP C FEW ESCHERICHIA COLI HOLDING FOR POSSIBLE ANAEROBE    Report Status PENDING  Incomplete   Organism ID, Bacteria ESCHERICHIA COLI  Final      Susceptibility   Escherichia coli - MIC*    AMPICILLIN <=2 SENSITIVE Sensitive     CEFAZOLIN <=4 SENSITIVE Sensitive     CEFEPIME <=0.12 SENSITIVE Sensitive     CEFTAZIDIME <=1 SENSITIVE Sensitive     CEFTRIAXONE <=0.25 SENSITIVE Sensitive     CIPROFLOXACIN <=0.25 SENSITIVE Sensitive     GENTAMICIN <=1 SENSITIVE Sensitive     IMIPENEM <=0.25 SENSITIVE Sensitive     TRIMETH/SULFA <=20 SENSITIVE Sensitive     AMPICILLIN/SULBACTAM <=2 SENSITIVE Sensitive     PIP/TAZO Value in next row Sensitive      <=4 SENSITIVEPerformed at [facility], [address] [place], [place] [address]    * FEW ESCHERICHIA COLI     Time coordinating discharge: Over 30 minutes  SIGNED:   Tresa Moore, MD  [facility] 03/30/2020, 12:08 PM Pager   If 7PM-7AM, please contact night-coverage

## 2020-03-30 NOTE — Discharge Instructions (Signed)
?   Percutaneous Abscess Drain, Care After This sheet gives you information about how to care for yourself after your procedure. Your health care provider may also give you more specific instructions. If you have problems or questions, contact your health care provider. What can I expect after the procedure? After your procedure, it is common to have:  A small amount of bruising and discomfort in the area where the drainage tube (catheter) was placed.  Sleepiness and fatigue. This should go away after the medicines you were given have worn off. Follow these instructions at home: Incision care  Follow instructions from your health care provider about how to take care of your incision. Make sure you: ? Wash your hands with soap and water before you change your bandage (dressing). If soap and water are not available, use hand sanitizer. ? Change your dressing as told by your health care provider. ? Leave stitches (sutures), skin glue, or adhesive strips in place. These skin closures may need to stay in place for 2 weeks or longer. If adhesive strip edges start to loosen and curl up, you may trim the loose edges. Do not remove adhesive strips completely unless your health care provider tells you to do that.  Check your incision area every day for signs of infection. Check for: ? More redness, swelling, or pain. ? More fluid or blood. ? Warmth. ? Pus or a bad smell. ? Fluid leaking from around your catheter (instead of fluid draining through your catheter). Catheter care   Follow instructions from your health care provider about emptying and cleaning your catheter and collection bag. You may need to clean the catheter every day so it does not clog.  If directed, write down the following information every time you empty your bag: ? The date and time. ? The amount of drainage. General instructions  Rest at home for 1-2 days after your procedure. Return to your normal activities as told by  your health care provider.  Do not take baths, swim, or use a hot tub for 24 hours after your procedure, or until your health care provider says that this is okay.  Take over-the-counter and prescription medicines only as told by your health care provider.  Keep all follow-up visits as told by your health care provider. This is important. Contact a health care provider if:  You have less than 10 mL of drainage a day for 2-3 days in a row, or as directed by your health care provider.  You have more redness, swelling, or pain around your incision area.  You have more fluid or blood coming from your incision area.  Your incision area feels warm to the touch.  You have pus or a bad smell coming from your incision area.  You have fluid leaking from around your catheter (instead of through your catheter).  You have a fever or chills.  You have pain that does not get better with medicine. Get help right away if:  Your catheter comes out.  You suddenly stop having drainage from your catheter.  You suddenly have blood in the fluid that is draining from your catheter.  You become dizzy or you faint.  You develop a rash.  You have nausea or vomiting.  You have difficulty breathing or you feel short of breath.  You develop chest pain.  You have problems with your speech or vision.  You have trouble balancing or moving your arms or legs. Summary  It is common to  have a small amount of bruising and discomfort in the area where the drainage tube (catheter) was placed.  You may be directed to record the amount of drainage from the bag every time you empty it.  Follow instructions from your health care provider about emptying and cleaning your catheter and collection bag. This information is not intended to replace advice given to you by your health care provider. Make sure you discuss any questions you have with your health care provider. Document Revised: 04/22/2017 Document  Reviewed: 04/01/2016 Elsevier Patient Education  2020 [employer]. Diverticulitis  Diverticulitis is infection or inflammation of small pouches (diverticula) in the colon that form due to a condition called diverticulosis. Diverticula can trap stool (feces) and bacteria, causing infection and inflammation. Diverticulitis may cause severe stomach pain and diarrhea. It may lead to tissue damage in the colon that causes bleeding. The diverticula may also burst (rupture) and cause infected stool to enter other areas of the abdomen. Complications of diverticulitis can include:  Bleeding.  Severe infection.  Severe pain.  Rupture (perforation) of the colon.  Blockage (obstruction) of the colon. What are the causes? This condition is caused by stool becoming trapped in the diverticula, which allows bacteria to grow in the diverticula. This leads to inflammation and infection. What increases the risk? You are more likely to develop this condition if:  You have diverticulosis. The risk for diverticulosis increases if: ? You are overweight or obese. ? You use tobacco products. ? You do not get enough exercise.  You eat a diet that does not include enough fiber. High-fiber foods include fruits, vegetables, beans, nuts, and whole grains. What are the signs or symptoms? Symptoms of this condition may include:  Pain and tenderness in the abdomen. The pain is normally located on the left side of the abdomen, but it may occur in other areas.  Fever and chills.  Bloating.  Cramping.  Nausea.  Vomiting.  Changes in bowel routines.  Blood in your stool. How is this diagnosed? This condition is diagnosed based on:  Your medical history.  A physical exam.  Tests to make sure there is nothing else causing your condition. These tests may include: ? Blood tests. ? Urine tests. ? Imaging tests of the abdomen, including X-rays, ultrasounds, MRIs, or CT scans. How is this  treated? Most cases of this condition are mild and can be treated at home. Treatment may include:  Taking over-the-counter pain medicines.  Following a clear liquid diet.  Taking antibiotic medicines by mouth.  Rest. More severe cases may need to be treated at a hospital. Treatment may include:  Not eating or drinking.  Taking prescription pain medicine.  Receiving antibiotic medicines through an IV tube.  Receiving fluids and nutrition through an IV tube.  Surgery. When your condition is under control, your health care provider may recommend that you have a colonoscopy. This is an exam to look at the entire large intestine. During the exam, a lubricated, bendable tube is inserted into the anus and then passed into the rectum, colon, and other parts of the large intestine. A colonoscopy can show how severe your diverticula are and whether something else may be causing your symptoms. Follow these instructions at home: Medicines  Take over-the-counter and prescription medicines only as told by your health care provider. These include fiber supplements, probiotics, and stool softeners.  If you were prescribed an antibiotic medicine, take it as told by your health care provider. Do not stop taking  the antibiotic even if you start to feel better.  Do not drive or use heavy machinery while taking prescription pain medicine. General instructions   Follow a full liquid diet or another diet as directed by your health care provider. After your symptoms improve, your health care provider may tell you to change your diet. He or she may recommend that you eat a diet that contains at least 25 g (25 grams) of fiber daily. Fiber makes it easier to pass stool. Healthy sources of fiber include: ? Berries. One cup contains 4-8 grams of fiber. ? Beans or lentils. One half cup contains 5-8 grams of fiber. ? Green vegetables. One cup contains 4 grams of fiber.  Exercise for at least 30 minutes, 3  times each week. You should exercise hard enough to raise your heart rate and break a sweat.  Keep all follow-up visits as told by your health care provider. This is important. You may need a colonoscopy. Contact a health care provider if:  Your pain does not improve.  You have a hard time drinking or eating food.  Your bowel movements do not return to normal. Get help right away if:  Your pain gets worse.  Your symptoms do not get better with treatment.  Your symptoms suddenly get worse.  You have a fever.  You vomit more than one time.  You have stools that are bloody, black, or tarry. Summary  Diverticulitis is infection or inflammation of small pouches (diverticula) in the colon that form due to a condition called diverticulosis. Diverticula can trap stool (feces) and bacteria, causing infection and inflammation.  You are at higher risk for this condition if you have diverticulosis and you eat a diet that does not include enough fiber.  Most cases of this condition are mild and can be treated at home. More severe cases may need to be treated at a hospital.  When your condition is under control, your health care provider may recommend that you have an exam called a colonoscopy. This exam can show how severe your diverticula are and whether something else may be causing your symptoms. This information is not intended to replace advice given to you by your health care provider. Make sure you discuss any questions you have with your health care provider. Document Revised: 04/22/2017 Document Reviewed: 06/12/2016 Elsevier Patient Education  2020 [employer]   Anorectal Abscess An abscess is an infected area that contains a collection of pus. An anorectal abscess is an abscess that is near the opening of the anus or around the rectum. Without treatment, an anorectal abscess can become larger and cause other problems, such as a more serious body-wide infection or pain,  especially during bowel movements. What are the causes? This condition is caused by plugged glands or an infection in one of these areas:  The anus.  The area between the anus and the scrotum in males or between the anus and the vagina in females (perineum). What increases the risk? The following factors may make you more likely to develop this condition:  Diabetes or inflammatory bowel disease.  Having a body defense system (immune system) that is weak.  Engaging in anal sex.  Having a sexually transmitted infection (STI).  Certain kinds of cancer, such as rectal carcinoma, leukemia, or lymphoma. What are the signs or symptoms? The main symptom of this condition is pain. The pain may be a throbbing pain that gets worse during bowel movements. Other symptoms include:  Swelling and  redness in the area of the abscess. The redness may go beyond the abscess and appear as a red streak on the skin.  A visible, painful lump, or a lump that can be felt when touched.  Bleeding or pus-like discharge from the area.  Fever.  General weakness.  Constipation.  Diarrhea. How is this diagnosed? This condition is diagnosed based on your medical history and a physical exam of the affected area.  This may involve examining the rectal area with a gloved hand (digital rectal exam).  Sometimes, the health care provider needs to look into the rectum using a probe, scope, or imaging test.  For women, it may require a careful vaginal exam. How is this treated? Treatment for this condition may include:  Incision and drainage surgery. This involves making an incision over the abscess to drain the pus.  Medicines, including antibiotic medicine, pain medicine, stool softeners, or laxatives. Follow these instructions at home: Medicines  Take over-the-counter and prescription medicines only as told by your health care provider.  If you were prescribed an antibiotic medicine, use it as told by  your health care provider. Do not stop using the antibiotic even if you start to feel better.  Do not drive or use heavy machinery while taking prescription pain medicine. Wound care   If gauze was used in the abscess, follow instructions from your health care provider about removing or changing the gauze. It can usually be removed in 2-3 days.  Wash your hands with soap and water before you remove or change your gauze. If soap and water are not available, use hand sanitizer.  If one or more drains were placed in the abscess cavity, be careful not to pull at them. Your health care provider will tell you how long they need to remain in place.  Check your incision area every day for signs of infection. Check for: ? More redness, swelling, or pain. ? More fluid or blood. ? Warmth. ? Pus or a bad smell. Managing pain, stiffness, and swelling   Take a sitz bath 3-4 times a day and after bowel movements. This will help reduce pain and swelling.  To relieve pain, try sitting: ? On a heating pad with the setting on low. ? On an inflatable donut-shaped cushion.  If directed, put ice on the affected area: ? Put ice in a plastic bag. ? Place a towel between your skin and the bag. ? Leave the ice on for 20 minutes, 2-3 times a day. General instructions  Follow any diet instructions given by your health care provider.  Keep all follow-up visits as told by your health care provider. This is important. Contact a health care provider if you have:  Bleeding from your incision.  Pain, swelling, or redness that does not improve or gets worse.  Trouble passing stool or urine.  Symptoms that return after treatment. Get help right away if you:  Have problems moving or using your legs.  Have severe or increasing pain.  Have swelling in the affected area that suddenly gets worse.  Have a large increase in bleeding or passing of pus.  Develop chills or a fever. Summary  An anorectal  abscess is an abscess that is near the opening of the anus or around the rectum. An abscess is an infected area that contains a collection of pus.  The main symptom of this condition is pain. It may be a throbbing pain that gets worse during bowel movements.  Treatment for  an anorectal abscess may include surgery to drain the pus from the abscess. Medicines and sitz baths may also be a part of your treatment plan. This information is not intended to replace advice given to you by your health care provider. Make sure you discuss any questions you have with your health care provider. Document Revised: 06/16/2017 Document Reviewed: 06/16/2017 Elsevier Patient Education  2020 [employer].

## 2020-03-30 NOTE — Progress Notes (Signed)
CC: Perforated diverticulitis Subjective: Doing well. No fevers no chills no abdominal pain. Tolerating diet  Objective: Vital signs in last 24 hours: Temp:  [97.4 F (36.3 C)-98.9 F (37.2 C)] 97.7 F (36.5 C) (11/07 1202) Pulse Rate:  [73-89] 73 (11/07 1202) Resp:  [16-18] 16 (11/07 1202) BP: (108-145)/(81-97) 139/82 (11/07 1202) SpO2:  [97 %-100 %] 97 % (11/07 1202) Last BM Date: 03/29/20  Intake/Output from previous day: 11/06 0701 - 11/07 0700 In: 359.6 [I.V.:34.3; IV Piggyback:320.2] Out: 510 [Urine:500; Drains:10] Intake/Output this shift: Total I/O In: 299.7 [P.O.:120; IV Piggyback:179.7] Out: 110 [Urine:100; Drains:10]  Physical exam: NAD ,abd: soft, nt Drain connected to JP bulb for easier care  Lab Results: CBC  Recent Labs    03/28/20 0759 03/29/20 0541  WBC 13.8* 12.2*  HGB 13.2 13.3  HCT 37.8* 39.2  PLT 271 293   BMET Recent Labs    03/28/20 0759 03/29/20 0541  NA 130* 131*  K 3.1* 3.7  CL 94* 96*  CO2 25 25  GLUCOSE 102* 98  BUN 14 12  CREATININE 0.88 0.86  CALCIUM 8.5* 8.6*   PT/INR No results for input(s): LABPROT, INR in the last 72 hours. ABG No results for input(s): PHART, HCO3 in the last 72 hours.  Invalid input(s): PCO2, PO2  Studies/Results: No results found.  Anti-infectives: Anti-infectives (From admission, onward)   Start     Dose/Rate Route Frequency Ordered Stop   03/30/20 0930  ciprofloxacin (CIPRO) tablet 500 mg        500 mg Oral 2 times daily 03/30/20 0840     03/30/20 0930  metroNIDAZOLE (FLAGYL) tablet 500 mg        500 mg Oral Every 8 hours 03/30/20 0840     03/30/20 0000  ciprofloxacin (CIPRO) 500 MG tablet  Status:  Discontinued        500 mg Oral 2 times daily 03/30/20 1153 03/30/20    03/30/20 0000  metroNIDAZOLE (FLAGYL) 500 MG tablet  Status:  Discontinued        500 mg Oral Every 8 hours 03/30/20 1153 03/30/20    03/30/20 0000  ciprofloxacin (CIPRO) 500 MG tablet        500 mg Oral 2 times daily  03/30/20 1247 04/09/20 2359   03/30/20 0000  metroNIDAZOLE (FLAGYL) 500 MG tablet        500 mg Oral Every 8 hours 03/30/20 1247 04/09/20 2359   03/27/20 1800  cefTRIAXone (ROCEPHIN) 2 g in sodium chloride 0.9 % 100 mL IVPB  Status:  Discontinued        2 g 200 mL/hr over 30 Minutes Intravenous Every 24 hours 03/26/20 2105 03/30/20 0840   03/27/20 0600  metroNIDAZOLE (FLAGYL) IVPB 500 mg  Status:  Discontinued        500 mg 100 mL/hr over 60 Minutes Intravenous Every 8 hours 03/26/20 2105 03/30/20 0840   03/26/20 2015  cefTRIAXone (ROCEPHIN) 1 g in sodium chloride 0.9 % 100 mL IVPB        1 g 200 mL/hr over 30 Minutes Intravenous  Once 03/26/20 2009 03/26/20 2101   03/26/20 2015  metroNIDAZOLE (FLAGYL) IVPB 500 mg        500 mg 100 mL/hr over 60 Minutes Intravenous  Once 03/26/20 2009 03/26/20 2224      Assessment/Plan:  Complicated diverticulitis. Ready for discharge. He will benefit from elective sigmoid colectomy at some point time I spent more than 25 minutes in this encounter with greater than 50%  spent in coordination and counseling of his care  Sterling Big, MD, [facility]  03/30/2020

## 2020-03-30 NOTE — Care Management (Addendum)
Reviewed discharge and noted no PCP. Spoke with patient to let him know he could go to [facility] for 1 week follow up as no PCP listed. Upon speaking to him he stated he sees Franco Nones FNP of [facility] at [address] [place], [place] [address]. He feels he will be able to get in to see her this week, but was appreciative of the [facility] information. He had no further questions or needs. Gabriel Cirri RN CM

## 2020-03-31 LAB — CULTURE, BLOOD (ROUTINE X 2)

## 2020-04-08 ENCOUNTER — Encounter: Payer: Self-pay | Admitting: General Surgery

## 2020-04-08 ENCOUNTER — Other Ambulatory Visit: Payer: Self-pay

## 2020-04-08 ENCOUNTER — Ambulatory Visit (INDEPENDENT_AMBULATORY_CARE_PROVIDER_SITE_OTHER): Payer: Medicare HMO | Admitting: General Surgery

## 2020-04-08 VITALS — BP 88/62 | HR 102 | Temp 98.2°F | Ht 66.0 in | Wt 156.0 lb

## 2020-04-08 DIAGNOSIS — K5720 Diverticulitis of large intestine with perforation and abscess without bleeding: Secondary | ICD-10-CM

## 2020-04-08 NOTE — Progress Notes (Signed)
Patient ID: Philip Huff, male   DOB: 16-Jun-1950, 69 y.o.   MRN: [id number]  Chief Complaint  Patient presents with  . Follow-up    Diverticulitis    HPI Philip Huff is a 69 y.o. male.   He is here today for follow-up after recent hospitalization for diverticulitis with abscess.  He was treated with percutaneous drainage of the abscess and antibiotics.  He still has a few more days of antibiotic therapy.  He denies any fevers or chills.  No nausea or vomiting.  His abdominal discomfort has resolved; he complains of primarily of discomfort at the gluteal drain insertion site.  He denies any diarrhea or constipation.  His drain has been putting out between 5 to 10 cc/day.  This was his first episode of diverticulitis.  He has not had a colonoscopy.  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   Past Medical History:  Diagnosis Date  . Hemophilia (HCC)    "semi hemophilia"  . Hypertension     Past Surgical History:  Procedure Laterality Date  . APPENDECTOMY      Family History  Problem Relation Age of Onset  . Diabetes Mother   . Heart disease Mother     Social History Social History   Tobacco Use  . Smoking status: Never Smoker  . Smokeless tobacco: Never Used  Substance Use Topics  . Alcohol use: Yes    Alcohol/week: 56.0 standard drinks    Types: 56 Cans of beer per week  . Drug use: No    No Known Allergies  Current Outpatient Medications  Medication Sig Dispense Refill  . ciprofloxacin (CIPRO) 500 MG tablet Take 1 tablet (500 mg total) by mouth 2 (two) times daily for 10 days. 20 tablet 0  . ezetimibe (ZETIA) 10 MG tablet Take 10 mg by mouth daily.    . hydrochlorothiazide (HYDRODIURIL) 25 MG tablet Take 25 mg by mouth daily.    Marland Kitchen losartan (COZAAR) 50 MG tablet Take 50 mg by mouth daily.    . metoprolol succinate (TOPROL-XL) 50 MG 24 hr tablet Take 50 mg by mouth daily.    . metroNIDAZOLE (FLAGYL) 500 MG  tablet Take 1 tablet (500 mg total) by mouth every 8 (eight) hours for 10 days. 30 tablet 0   No current facility-administered medications for this visit.    Review of Systems Review of Systems  All other systems reviewed and are negative. Or as discussed in the history of present illness.  Blood pressure (!) 88/62, pulse (!) 102, temperature 98.2 F (36.8 C), height 5\' 6"  (1.676 m), weight 156 lb (70.8 kg), SpO2 99 %. Body mass index is 25.18 kg/m.  Physical Exam Physical Exam Constitutional:      General: He is not in acute distress.    Appearance: Normal appearance.  HENT:     Head: Normocephalic and atraumatic.     Nose:     Comments: Covered with a mask    Mouth/Throat:     Comments: Covered with a mask Eyes:     General: No scleral icterus.       Right eye: No discharge.        Left eye: No discharge.  Cardiovascular:     Rate and Rhythm: Normal rate and regular rhythm.     Comments: Although the recorded vital signs indicate tachycardia, at the time of my exam he has a normal rate. Pulmonary:  Effort: Pulmonary effort is normal.     Breath sounds: Normal breath sounds.  Abdominal:     General: Bowel sounds are normal. There is no distension.     Palpations: Abdomen is soft.     Tenderness: There is no abdominal tenderness.  Genitourinary:    Comments: There is a drain in his left gluteus with a small amount of purulent fluid in the tubing. Musculoskeletal:        General: No swelling or deformity.     Cervical back: Normal range of motion.  Lymphadenopathy:     Cervical: No cervical adenopathy.  Skin:    General: Skin is warm and dry.  Neurological:     General: No focal deficit present.     Mental Status: He is alert and oriented to person, place, and time.  Psychiatric:        Mood and Affect: Mood normal.        Behavior: Behavior normal.     Data Reviewed I reviewed his hospital course from his admission November 3 through November 7.  During  this visit, he had a drain placed by interventional radiology.  Assessment This is a 69 year old man who was admitted with diverticulitis with abscess.  The abscess was drained percutaneously and his drain is putting out very little at this time.  I removed it in clinic today.  Plan I have recommended that he undergo colonoscopy.  He should complete his course of antibiotics.  His primary care provider can make this referral and arrange for the colonoscopy.  I have recommended that he increase the fiber in his diet either by dietary or supplemental means.  As this was his first episode of diverticulitis, he does not necessarily have to pursue elective sigmoid colectomy, but if he has recurrence, this may be indicated.  He should follow-up with hematology for further characterization of the bleeding disorder reported, should elective surgical intervention be indicated.  At this time, I have not made a follow-up visit for him but we will see him on an as-needed basis.    Duanne Guess 04/08/2020, 10:15 AM

## 2020-04-08 NOTE — Patient Instructions (Addendum)
You may remove your dressing tonight. You may cover this with a Band-Aid as needed until it closes fully.   You may take Ibuprofen or Tylenol as needed. Finish all of your antibiotics.  You need to have a Colonoscopy completed to fully assess your colon. You may speak with your Primary Care Physician about this.   You may eat any foods as tolerated. You need to maintain a high fiber diet. You also should take a fiber supplement daily.   Follow-up with our office as needed.  Please call and ask to speak with a nurse if you develop questions or concerns.   Diverticulosis  Diverticulosis is a condition that develops when small pouches (diverticula) form in the wall of the large intestine (colon). The colon is where water is absorbed and stool (feces) is formed. The pouches form when the inside layer of the colon pushes through weak spots in the outer layers of the colon. You may have a few pouches or many of them. The pouches usually do not cause problems unless they become inflamed or infected. When this happens, the condition is called diverticulitis. What are the causes? The cause of this condition is not known. What increases the risk? The following factors may make you more likely to develop this condition:  Being older than age 52. Your risk for this condition increases with age. Diverticulosis is rare among people younger than age 19. By age 79, many people have it.  Eating a low-fiber diet.  Having frequent constipation.  Being overweight.  Not getting enough exercise.  Smoking.  Taking over-the-counter pain medicines, like aspirin and ibuprofen.  Having a family history of diverticulosis. What are the signs or symptoms? In most people, there are no symptoms of this condition. If you do have symptoms, they may include:  Bloating.  Cramps in the abdomen.  Constipation or diarrhea.  Pain in the lower left side of the abdomen. How is this diagnosed? Because  diverticulosis usually has no symptoms, it is most often diagnosed during an exam for other colon problems. The condition may be diagnosed by:  Using a flexible scope to examine the colon (colonoscopy).  Taking an X-ray of the colon after dye has been put into the colon (barium enema).  Having a CT scan. How is this treated? You may not need treatment for this condition. Your health care provider may recommend treatment to prevent problems. You may need treatment if you have symptoms or if you previously had diverticulitis. Treatment may include:  Eating a high-fiber diet.  Taking a fiber supplement.  Taking a live bacteria supplement (probiotic).  Taking medicine to relax your colon. Follow these instructions at home: Medicines  Take over-the-counter and prescription medicines only as told by your health care provider.  If told by your health care provider, take a fiber supplement or probiotic. Constipation prevention Your condition may cause constipation. To prevent or treat constipation, you may need to:  Drink enough fluid to keep your urine pale yellow.  Take over-the-counter or prescription medicines.  Eat foods that are high in fiber, such as beans, whole grains, and fresh fruits and vegetables.  Limit foods that are high in fat and processed sugars, such as fried or sweet foods.  General instructions  Try not to strain when you have a bowel movement.  Keep all follow-up visits as told by your health care provider. This is important. Contact a health care provider if you:  Have pain in your abdomen.  Have  bloating.  Have cramps.  Have not had a bowel movement in 3 days. Get help right away if:  Your pain gets worse.  Your bloating becomes very bad.  You have a fever or chills, and your symptoms suddenly get worse.  You vomit.  You have bowel movements that are bloody or black.  You have bleeding from your rectum. Summary  Diverticulosis is a  condition that develops when small pouches (diverticula) form in the wall of the large intestine (colon).  You may have a few pouches or many of them.  This condition is most often diagnosed during an exam for other colon problems.  Treatment may include increasing the fiber in your diet, taking supplements, or taking medicines. This information is not intended to replace advice given to you by your health care provider. Make sure you discuss any questions you have with your health care provider. Document Revised: 12/07/2018 Document Reviewed: 12/07/2018 Elsevier Patient Education  2020 [employer].

## 2021-03-10 ENCOUNTER — Encounter: Payer: Self-pay | Admitting: General Surgery

## 2021-12-02 IMAGING — CT CT ABD-PELV W/ CM
2 of 5 series · 16 of 46 positions shown, 18 images · IV contrast (APPLIED)
Comparison: None.

CLINICAL DATA: Abdominal pain for several days

EXAM:
CT ABDOMEN AND PELVIS WITH CONTRAST
TECHNIQUE: Multidetector CT imaging of the abdomen and pelvis was performed
using the standard protocol following bolus administration of
intravenous contrast.
CONTRAST:  75mL OMNIPAQUE IOHEXOL 350 MG/ML SOLN

[Series 2: routine abd/pel with · axial · 0.75mm/px · z∈[-536,-141]mm · 13 of 89 slices shown, 15 images]
[im 5/89  soft-tissue]
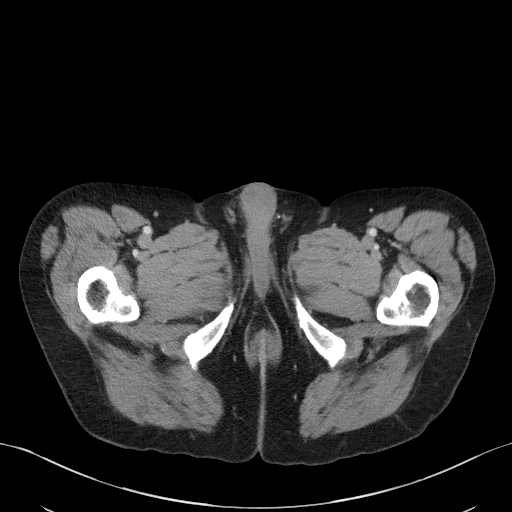
[im 5/89  bone]
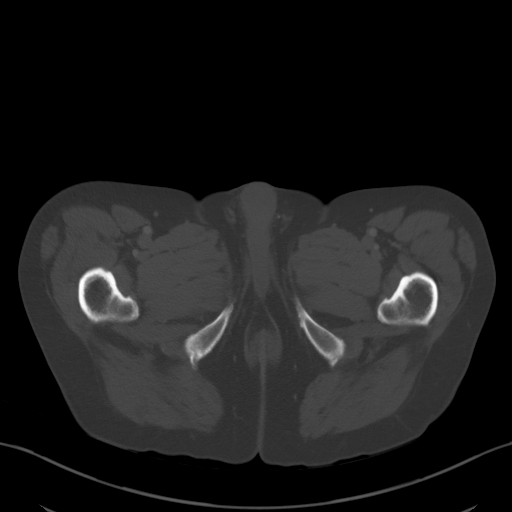
[im 14/89  soft-tissue]
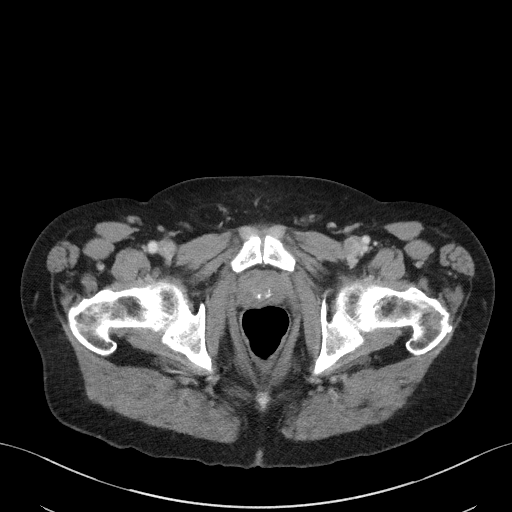
[im 18/89  soft-tissue]
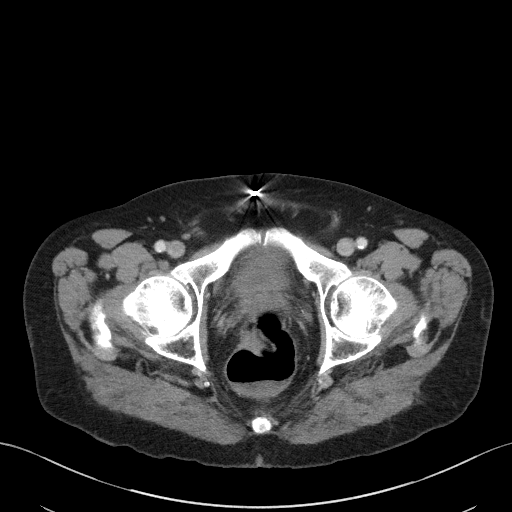
[im 27/89  soft-tissue]
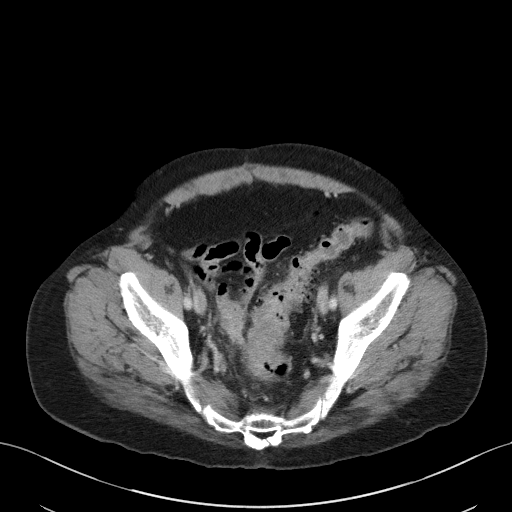
[im 31/89  soft-tissue]
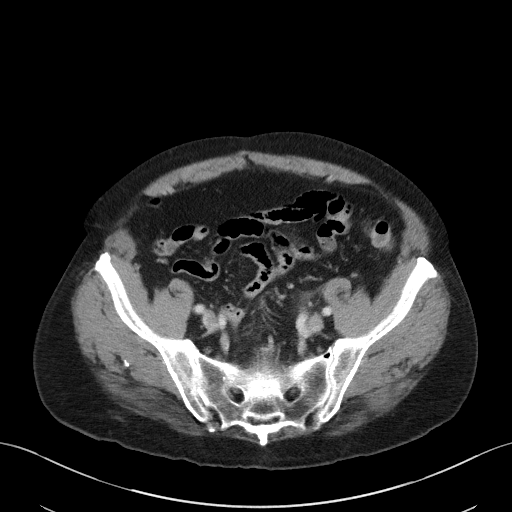
[im 40/89  soft-tissue]
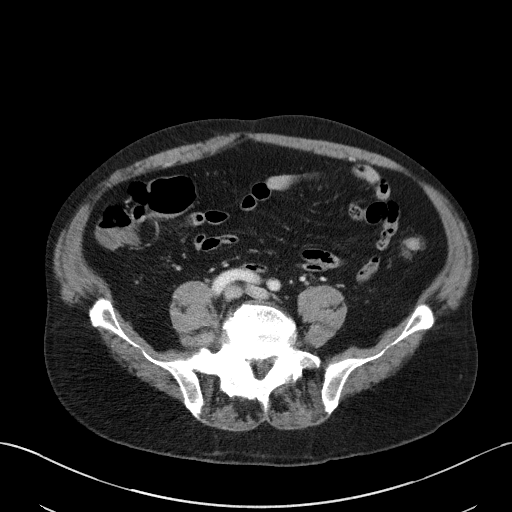
[im 45/89  soft-tissue]
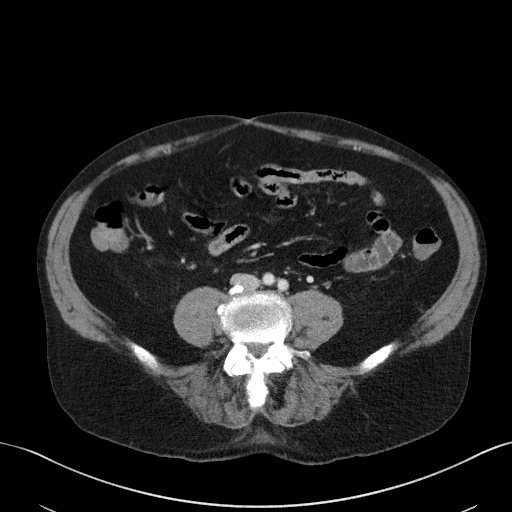
[im 49/89  soft-tissue]
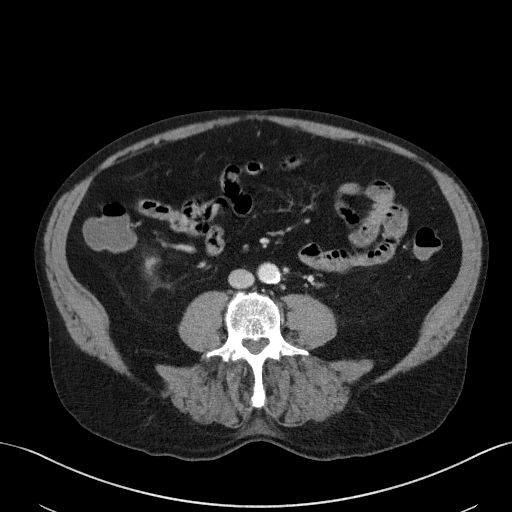
[im 58/89  soft-tissue]
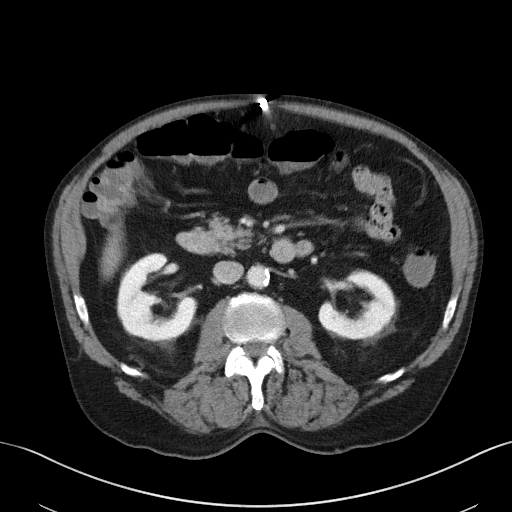
[im 58/89  bone]
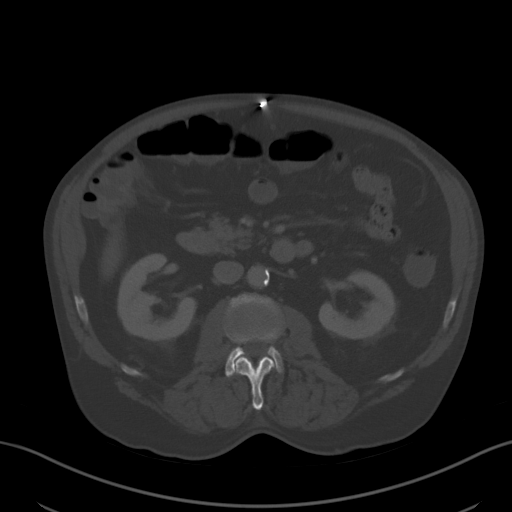
[im 62/89  soft-tissue]
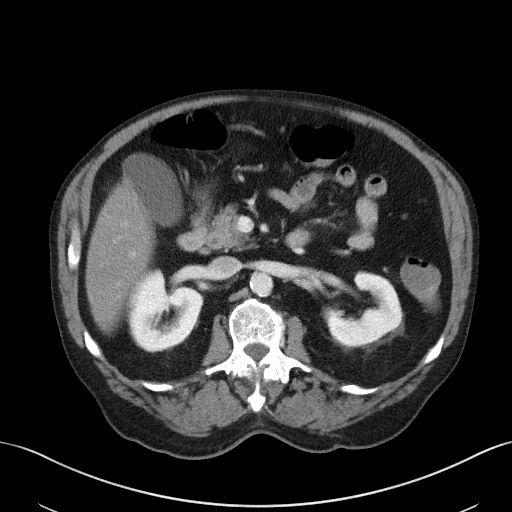
[im 71/89  soft-tissue]
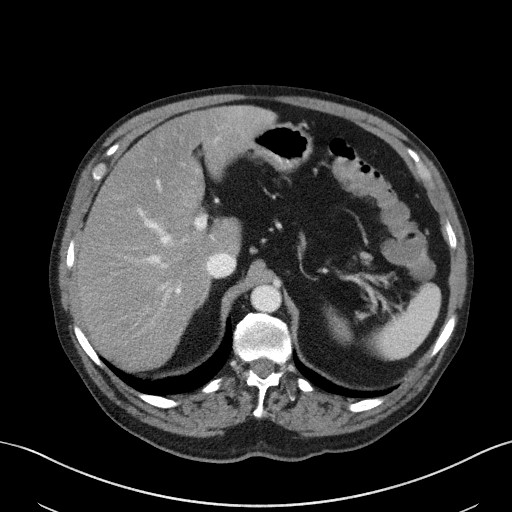
[im 75/89  soft-tissue]
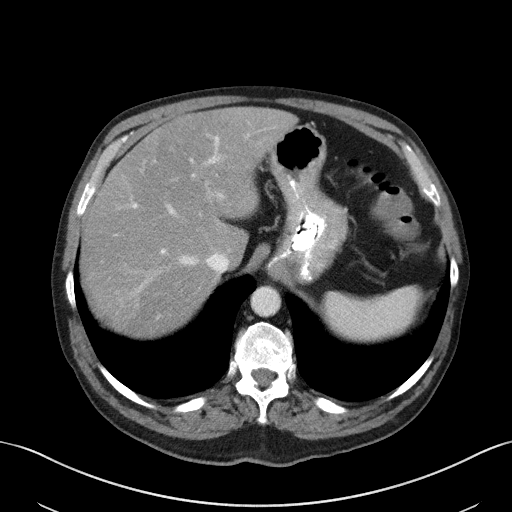
[im 84/89  soft-tissue]
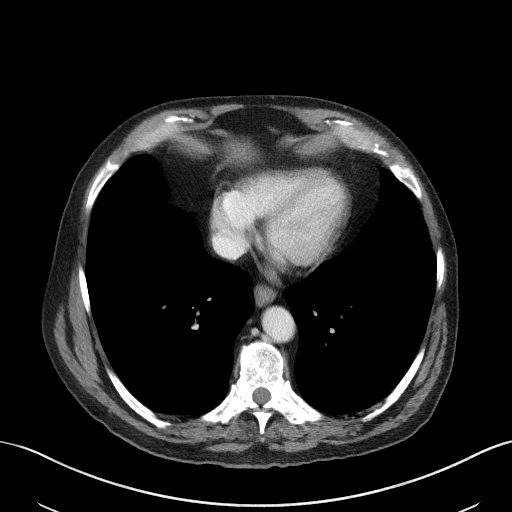

[Series 5: coronal st · coronal · 0.71mm/px · 3 of 92 slices shown]
[im 31/92  soft-tissue]
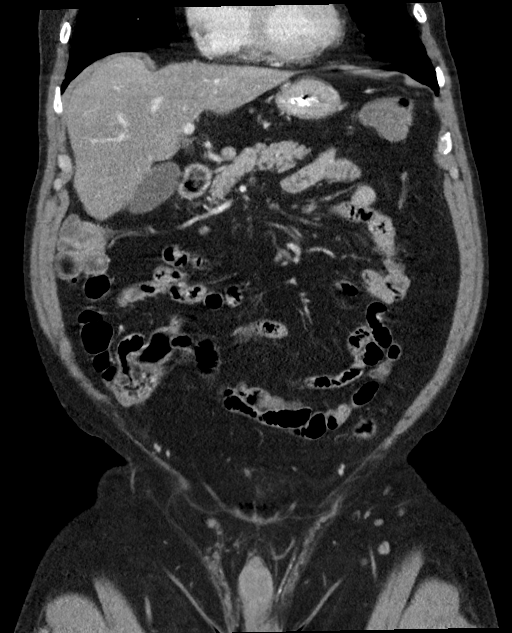
[im 41/92  soft-tissue]
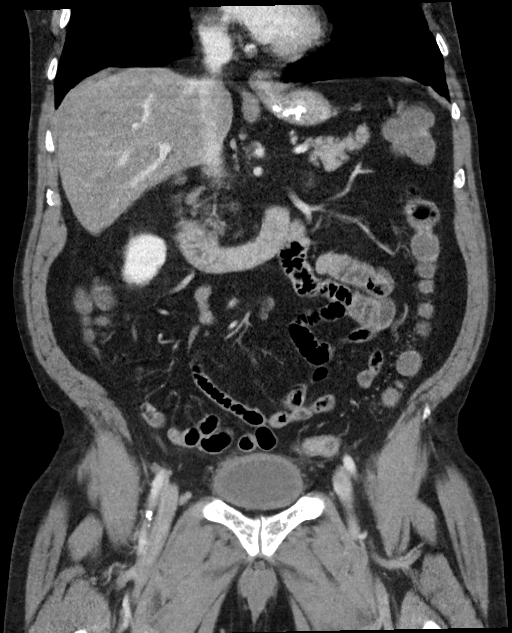
[im 51/92  soft-tissue]
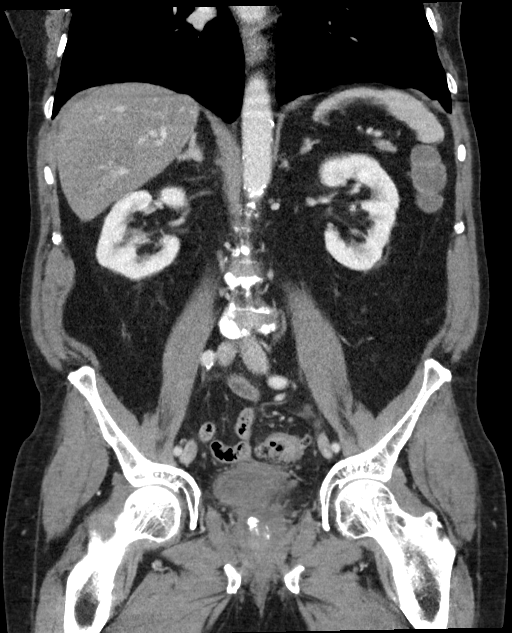

[16 of 46 positions shown; findings below may reference images not displayed]

FINDINGS: Lower chest: No acute abnormality.

Hepatobiliary: Fatty infiltration of the liver is noted. The
gallbladder is within normal limits.

Pancreas: Pancreas is unremarkable.

Spleen: Normal in size without focal abnormality.

Adrenals/Urinary Tract: Adrenal glands are within normal limits.
Kidneys demonstrate a normal enhancement pattern bilaterally. No
renal calculi or urinary tract obstructive changes are seen. Delayed
images demonstrate normal excretion of contrast. No obstructive
changes are seen. The bladder is partially distended.

Stomach/Bowel: Diverticular change of the colon is noted with
evidence of diverticulitis in the distal sigmoid colon. There is a
focal air-fluid collection (4.7 x 3.5 cm) identified just anterior
to the rectum best seen on image number 68 of series 2 consistent
with a diverticular abscess. This likely extends inferiorly from the
more superior sigmoid diverticulitis. The more proximal colon is
within normal limits. The appendix is not well visualized although
no inflammatory changes to suggest appendicitis are noted. Small
bowel and stomach appear within normal limits.

Vascular/Lymphatic: Aortic atherosclerosis. No enlarged abdominal or
pelvic lymph nodes.

Reproductive: Prostate is unremarkable.

Other: No abdominal wall hernia or abnormality. No abdominopelvic
ascites.

Musculoskeletal: No acute or significant osseous findings.
IMPRESSION: Diverticulitis with evidence of a pre rectal abscess as described
above measuring 4.7 cm in greatest dimension. This likely a extends
inferiorly from the sigmoid diverticulitis. No definitive
fistulization to the bladder is seen.

Fatty liver.

## 2022-09-22 LAB — EXTERNAL GENERIC LAB PROCEDURE: COLOGUARD: POSITIVE — AB

## 2022-09-22 LAB — COLOGUARD: COLOGUARD: POSITIVE — AB

## 2023-04-14 ENCOUNTER — Encounter: Payer: Self-pay | Admitting: Internal Medicine

## 2023-05-04 ENCOUNTER — Ambulatory Visit: Admission: RE | Admit: 2023-05-04 | Payer: Medicare HMO | Source: Home / Self Care | Admitting: Internal Medicine

## 2023-05-04 ENCOUNTER — Encounter: Admission: RE | Payer: Self-pay | Source: Home / Self Care

## 2023-05-04 SURGERY — COLONOSCOPY WITH PROPOFOL
Anesthesia: General

## 2024-02-22 ENCOUNTER — Inpatient Hospital Stay

## 2024-02-22 ENCOUNTER — Inpatient Hospital Stay: Attending: Oncology | Admitting: Oncology

## 2024-02-22 ENCOUNTER — Encounter: Payer: Self-pay | Admitting: Oncology

## 2024-02-22 VITALS — BP 118/78 | HR 87 | Temp 99.0°F | Resp 18 | Wt 155.0 lb

## 2024-02-22 DIAGNOSIS — I10 Essential (primary) hypertension: Secondary | ICD-10-CM | POA: Insufficient documentation

## 2024-02-22 DIAGNOSIS — R7989 Other specified abnormal findings of blood chemistry: Secondary | ICD-10-CM | POA: Insufficient documentation

## 2024-02-22 DIAGNOSIS — D66 Hereditary factor VIII deficiency: Secondary | ICD-10-CM | POA: Diagnosis not present

## 2024-02-22 DIAGNOSIS — Z79899 Other long term (current) drug therapy: Secondary | ICD-10-CM | POA: Diagnosis not present

## 2024-02-22 LAB — CBC WITH DIFFERENTIAL/PLATELET
Abs Immature Granulocytes: 0.03 K/uL (ref 0.00–0.07)
Basophils Absolute: 0.1 K/uL (ref 0.0–0.1)
Basophils Relative: 1 %
Eosinophils Absolute: 0.1 K/uL (ref 0.0–0.5)
Eosinophils Relative: 1 %
HCT: 45.8 % (ref 39.0–52.0)
Hemoglobin: 16 g/dL (ref 13.0–17.0)
Immature Granulocytes: 0 %
Lymphocytes Relative: 16 %
Lymphs Abs: 1.5 K/uL (ref 0.7–4.0)
MCH: 31.8 pg (ref 26.0–34.0)
MCHC: 34.9 g/dL (ref 30.0–36.0)
MCV: 91.1 fL (ref 80.0–100.0)
Monocytes Absolute: 0.8 K/uL (ref 0.1–1.0)
Monocytes Relative: 8 %
Neutro Abs: 6.8 K/uL (ref 1.7–7.7)
Neutrophils Relative %: 74 %
Platelets: 304 K/uL (ref 150–400)
RBC: 5.03 MIL/uL (ref 4.22–5.81)
RDW: 12.3 % (ref 11.5–15.5)
WBC: 9.3 K/uL (ref 4.0–10.5)
nRBC: 0 % (ref 0.0–0.2)

## 2024-02-22 LAB — COMPREHENSIVE METABOLIC PANEL WITH GFR
ALT: 24 U/L (ref 0–44)
AST: 34 U/L (ref 15–41)
Albumin: 4.1 g/dL (ref 3.5–5.0)
Alkaline Phosphatase: 63 U/L (ref 38–126)
Anion gap: 11 (ref 5–15)
BUN: 15 mg/dL (ref 8–23)
CO2: 23 mmol/L (ref 22–32)
Calcium: 9.8 mg/dL (ref 8.9–10.3)
Chloride: 96 mmol/L — ABNORMAL LOW (ref 98–111)
Creatinine, Ser: 1.19 mg/dL (ref 0.61–1.24)
GFR, Estimated: >60 mL/min (ref >60)
Glucose, Bld: 106 mg/dL — ABNORMAL HIGH (ref 70–99)
Potassium: 3.4 mmol/L — ABNORMAL LOW (ref 3.5–5.1)
Sodium: 130 mmol/L — ABNORMAL LOW (ref 135–145)
Total Bilirubin: 1.2 mg/dL (ref 0.0–1.2)
Total Protein: 7.8 g/dL (ref 6.5–8.1)

## 2024-02-22 LAB — IRON AND TIBC
Iron: 122 ug/dL (ref 45–182)
Saturation Ratios: 37 % (ref 17.9–39.5)
TIBC: 335 ug/dL (ref 250–450)
UIBC: 213 ug/dL

## 2024-02-22 LAB — FERRITIN: Ferritin: 540 ng/mL — ABNORMAL HIGH (ref 24–336)

## 2024-02-22 NOTE — Progress Notes (Unsigned)
 Patient is not having any symptoms as of right now.

## 2024-02-22 NOTE — Progress Notes (Unsigned)
 [facility]  Telephone:(336) [phone] Fax:(336) [phone]  ID: Philip Huff OB: Feb 18, 1951  MR#: [id number]  RDW#:[id number]  Patient Care Team: [name], FNP as PCP - General (Family Medicine)  CHIEF COMPLAINT: Elevated ferritin.  INTERVAL HISTORY: Patient is a 73 year old male who was noted to have an elevated ferritin level on routine blood work.  He currently feels well and is asymptomatic.  He has no neurologic complaints.  He denies any recent fevers or illnesses.  He has a good appetite and denies weight loss.  He does not have any weakness or fatigue.  He has no chest pain, shortness of breath, cough, or hemoptysis.  He denies any nausea, vomiting, constipation, or diarrhea.  He has no urinary complaints.  Patient offers no specific complaints today.  REVIEW OF SYSTEMS:   Review of Systems  Constitutional: Negative.  Negative for fever, malaise/fatigue and weight loss.  Respiratory: Negative.  Negative for cough, hemoptysis and shortness of breath.   Cardiovascular: Negative.  Negative for chest pain and leg swelling.  Gastrointestinal: Negative.  Negative for abdominal pain.  Genitourinary: Negative.  Negative for dysuria.  Musculoskeletal: Negative.  Negative for back pain.  Skin: Negative.  Negative for rash.  Neurological: Negative.  Negative for dizziness, focal weakness, weakness and headaches.  Psychiatric/Behavioral: Negative.  The patient is not nervous/anxious.     As per HPI. Otherwise, a complete review of systems is negative.  PAST MEDICAL HISTORY: Past Medical History:  Diagnosis Date   Hemophilia (HCC)    semi hemophilia   Hypertension     PAST SURGICAL HISTORY: Past Surgical History:  Procedure Laterality Date   APPENDECTOMY      FAMILY HISTORY: Family History  Problem Relation Age of Onset   Diabetes Mother    Heart disease Mother     ADVANCED DIRECTIVES (Y/N):  N  HEALTH MAINTENANCE: Social History    Tobacco Use   Smoking status: Never   Smokeless tobacco: Never  Substance Use Topics   Alcohol use: Yes    Alcohol/week: 56.0 standard drinks of alcohol    Types: 56 Cans of beer per week   Drug use: No     Colonoscopy:  PAP:  Bone density:  Lipid panel:  No Known Allergies  Current Outpatient Medications  Medication Sig Dispense Refill   ezetimibe  (ZETIA ) 10 MG tablet Take 10 mg by mouth daily.     hydrochlorothiazide  (HYDRODIURIL ) 25 MG tablet Take 25 mg by mouth daily.     losartan (COZAAR) 50 MG tablet Take 50 mg by mouth daily.     metoprolol  succinate (TOPROL -XL) 50 MG 24 hr tablet Take 50 mg by mouth daily.     No current facility-administered medications for this visit.    OBJECTIVE: Vitals:   02/22/24 1314  BP: 118/78  Pulse: 87  Resp: 18  Temp: 99 F (37.2 C)  SpO2: 99%     Body mass index is 25.02 kg/m.    ECOG FS:0 - Asymptomatic  General: Well-developed, well-nourished, no acute distress. Eyes: Pink conjunctiva, anicteric sclera. HEENT: Normocephalic, moist mucous membranes. Lungs: No audible wheezing or coughing. Heart: Regular rate and rhythm. Abdomen: Soft, nontender, no obvious distention. Musculoskeletal: No edema, cyanosis, or clubbing. Neuro: Alert, answering all questions appropriately. Cranial nerves grossly intact. Skin: No rashes or petechiae noted. Psych: Normal affect. Lymphatics: No cervical, calvicular, axillary or inguinal LAD.   LAB RESULTS:  Lab Results  Component Value Date   NA 130 (L) 02/22/2024  K 3.4 (L) 02/22/2024   CL 96 (L) 02/22/2024   CO2 23 02/22/2024   GLUCOSE 106 (H) 02/22/2024   BUN 15 02/22/2024   CREATININE 1.19 02/22/2024   CALCIUM 9.8 02/22/2024   PROT 7.8 02/22/2024   ALBUMIN 4.1 02/22/2024   AST 34 02/22/2024   ALT 24 02/22/2024   ALKPHOS 63 02/22/2024   BILITOT 1.2 02/22/2024   GFRNONAA >60 02/22/2024   GFRAA >60 10/04/2015    Lab Results  Component Value Date   WBC 9.3 02/22/2024    NEUTROABS 6.8 02/22/2024   HGB 16.0 02/22/2024   HCT 45.8 02/22/2024   MCV 91.1 02/22/2024   PLT 304 02/22/2024   Lab Results  Component Value Date   IRON 122 02/22/2024   TIBC 335 02/22/2024   IRONPCTSAT 37 02/22/2024   Lab Results  Component Value Date   FERRITIN 540 (H) 02/22/2024     STUDIES: No results found.  ASSESSMENT: Elevated ferritin level.  PLAN:    Elevated ferritin: Patient noted to have persistently elevated ferritin level of 540.  His hemoglobin and the remainder of his iron panel is within normal limits.  This mutation for completeness.  No intervention is needed at this time.  Patient not require phlebotomy or other treatment.  Return to clinic in 3 weeks for further evaluation and discussion of his laboratory results.  I spent a total of 45 minutes reviewing chart data, face-to-face evaluation with the patient, counseling and coordination of care as detailed above.   Patient expressed understanding and was in agreement with this plan. He also understands that He can call clinic at any time with any questions, concerns, or complaints.    Cancer Staging  No matching staging information was found for the patient.   Evalene JINNY Reusing, MD   02/23/2024 7:43 AM   \

## 2024-02-27 LAB — HEMOCHROMATOSIS DNA-PCR(C282Y,H63D)

## 2024-03-14 ENCOUNTER — Encounter: Payer: Self-pay | Admitting: Oncology

## 2024-03-14 ENCOUNTER — Inpatient Hospital Stay: Admitting: Oncology

## 2024-03-14 VITALS — BP 130/70 | HR 77 | Temp 97.8°F | Resp 18 | Wt 158.0 lb

## 2024-03-14 DIAGNOSIS — R7989 Other specified abnormal findings of blood chemistry: Secondary | ICD-10-CM | POA: Diagnosis not present

## 2024-03-14 NOTE — Progress Notes (Unsigned)
 [facility]  Telephone:(336) [phone] Fax:(336) [phone]  ID: Philip Huff OB: 18-Jun-1950  MR#: [id number]  RDW#:[id number]  Patient Care Team: [name], FNP as PCP - General (Family Medicine)  CHIEF COMPLAINT: Elevated ferritin.  INTERVAL HISTORY: Patient returns to clinic today for further evaluation and discussion of his laboratory work.  He continues to feel well and remains asymptomatic.  He has no neurologic complaints.  He denies any recent fevers or illnesses.  He has a good appetite and denies weight loss.  He does not have any weakness or fatigue.  He has no chest pain, shortness of breath, cough, or hemoptysis.  He denies any nausea, vomiting, constipation, or diarrhea.  He has no urinary complaints.  Patient offers no specific complaints today.  REVIEW OF SYSTEMS:   Review of Systems  Constitutional: Negative.  Negative for fever, malaise/fatigue and weight loss.  Respiratory: Negative.  Negative for cough, hemoptysis and shortness of breath.   Cardiovascular: Negative.  Negative for chest pain and leg swelling.  Gastrointestinal: Negative.  Negative for abdominal pain.  Genitourinary: Negative.  Negative for dysuria.  Musculoskeletal: Negative.  Negative for back pain.  Skin: Negative.  Negative for rash.  Neurological: Negative.  Negative for dizziness, focal weakness, weakness and headaches.  Psychiatric/Behavioral: Negative.  The patient is not nervous/anxious.     As per HPI. Otherwise, a complete review of systems is negative.  PAST MEDICAL HISTORY: Past Medical History:  Diagnosis Date   Hemophilia (HCC)    semi hemophilia   Hypertension     PAST SURGICAL HISTORY: Past Surgical History:  Procedure Laterality Date   APPENDECTOMY      FAMILY HISTORY: Family History  Problem Relation Age of Onset   Diabetes Mother    Heart disease Mother     ADVANCED DIRECTIVES (Y/N):  N  HEALTH MAINTENANCE: Social History    Tobacco Use   Smoking status: Never   Smokeless tobacco: Never  Substance Use Topics   Alcohol use: Yes    Alcohol/week: 56.0 standard drinks of alcohol    Types: 56 Cans of beer per week   Drug use: No     Colonoscopy:  PAP:  Bone density:  Lipid panel:  No Known Allergies  Current Outpatient Medications  Medication Sig Dispense Refill   ezetimibe  (ZETIA ) 10 MG tablet Take 10 mg by mouth daily.     hydrochlorothiazide  (HYDRODIURIL ) 25 MG tablet Take 25 mg by mouth daily.     losartan (COZAAR) 50 MG tablet Take 50 mg by mouth daily.     metoprolol  succinate (TOPROL -XL) 50 MG 24 hr tablet Take 50 mg by mouth daily.     No current facility-administered medications for this visit.    OBJECTIVE: Vitals:   03/14/24 1411  BP: 130/70  Pulse: 77  Resp: 18  Temp: 97.8 F (36.6 C)  SpO2: 99%     Body mass index is 25.5 kg/m.    ECOG FS:0 - Asymptomatic  General: Well-developed, well-nourished, no acute distress. Eyes: Pink conjunctiva, anicteric sclera. HEENT: Normocephalic, moist mucous membranes. Lungs: No audible wheezing or coughing. Heart: Regular rate and rhythm. Abdomen: Soft, nontender, no obvious distention. Musculoskeletal: No edema, cyanosis, or clubbing. Neuro: Alert, answering all questions appropriately. Cranial nerves grossly intact. Skin: No rashes or petechiae noted. Psych: Normal affect.  LAB RESULTS:  Lab Results  Component Value Date   NA 130 (L) 02/22/2024   K 3.4 (L) 02/22/2024   CL 96 (L) 02/22/2024  CO2 23 02/22/2024   GLUCOSE 106 (H) 02/22/2024   BUN 15 02/22/2024   CREATININE 1.19 02/22/2024   CALCIUM 9.8 02/22/2024   PROT 7.8 02/22/2024   ALBUMIN 4.1 02/22/2024   AST 34 02/22/2024   ALT 24 02/22/2024   ALKPHOS 63 02/22/2024   BILITOT 1.2 02/22/2024   GFRNONAA >60 02/22/2024   GFRAA >60 10/04/2015    Lab Results  Component Value Date   WBC 9.3 02/22/2024   NEUTROABS 6.8 02/22/2024   HGB 16.0 02/22/2024   HCT 45.8  02/22/2024   MCV 91.1 02/22/2024   PLT 304 02/22/2024   Lab Results  Component Value Date   IRON 122 02/22/2024   TIBC 335 02/22/2024   IRONPCTSAT 37 02/22/2024   Lab Results  Component Value Date   FERRITIN 540 (H) 02/22/2024     STUDIES: No results found.  ASSESSMENT: Elevated ferritin level.  PLAN:    Elevated ferritin: Patient noted to have persistently elevated ferritin level of 540.  All of his other laboratory work including hemochromatosis mutation is either negative or within normal limits.  No further intervention is needed.  Patient does not require phlebotomy.  Continue to monitor ferritin levels 1 or 2 times per year and if it continues to trend up and is greater than 1000, please refer patient back for further evaluation.   I spent a total of 20 minutes reviewing chart data, face-to-face evaluation with the patient, counseling and coordination of care as detailed above.   Patient expressed understanding and was in agreement with this plan. He also understands that He can call clinic at any time with any questions, concerns, or complaints.     Evalene JINNY Reusing, MD   03/15/2024 7:29 AM   \

## 2024-07-05 ENCOUNTER — Ambulatory Visit: Admitting: Family Medicine
